# Patient Record
Sex: Female | Born: 1965
Health system: Southern US, Community
[De-identification: ages and names within clinical notes are randomized; demographics above are authoritative.]

## PROBLEM LIST (undated history)

## (undated) DIAGNOSIS — J45909 Unspecified asthma, uncomplicated: Secondary | ICD-10-CM

## (undated) DIAGNOSIS — Z803 Family history of malignant neoplasm of breast: Secondary | ICD-10-CM

## (undated) DIAGNOSIS — T7840XA Allergy, unspecified, initial encounter: Secondary | ICD-10-CM

## (undated) DIAGNOSIS — N63 Unspecified lump in unspecified breast: Secondary | ICD-10-CM

## (undated) DIAGNOSIS — N189 Chronic kidney disease, unspecified: Secondary | ICD-10-CM

## (undated) DIAGNOSIS — I10 Essential (primary) hypertension: Secondary | ICD-10-CM

## (undated) HISTORY — DX: Allergy, unspecified, initial encounter: T78.40XA

## (undated) HISTORY — PX: DILATION AND CURETTAGE OF UTERUS: SHX78

## (undated) HISTORY — PX: HAND SURGERY: SHX662

## (undated) HISTORY — DX: Unspecified asthma, uncomplicated: J45.909

## (undated) HISTORY — DX: Family history of malignant neoplasm of breast: Z80.3

## (undated) HISTORY — DX: Essential (primary) hypertension: I10

## (undated) HISTORY — DX: Unspecified lump in unspecified breast: N63.0

## (undated) HISTORY — DX: Chronic kidney disease, unspecified: N18.9

---

## 2005-02-22 ENCOUNTER — Other Ambulatory Visit: Admission: RE | Admit: 2005-02-22 | Discharge: 2005-02-22 | Payer: Self-pay | Admitting: Obstetrics and Gynecology

## 2006-04-30 ENCOUNTER — Encounter: Admission: RE | Admit: 2006-04-30 | Discharge: 2006-04-30 | Payer: Self-pay | Admitting: Obstetrics and Gynecology

## 2007-08-12 ENCOUNTER — Inpatient Hospital Stay (HOSPITAL_COMMUNITY): Admission: AD | Admit: 2007-08-12 | Discharge: 2007-08-14 | Payer: Self-pay | Admitting: Obstetrics and Gynecology

## 2010-12-11 ENCOUNTER — Encounter: Payer: Self-pay | Admitting: Obstetrics and Gynecology

## 2011-08-31 LAB — RAPID HIV SCREEN (WH-MAU): Rapid HIV Screen: NONREACTIVE

## 2011-08-31 LAB — CBC
Hemoglobin: 13.7
MCHC: 34.4
MCHC: 34.6
MCV: 91.2
RBC: 4.33
WBC: 12 — ABNORMAL HIGH

## 2012-03-22 ENCOUNTER — Other Ambulatory Visit: Payer: Self-pay | Admitting: Obstetrics and Gynecology

## 2012-03-22 DIAGNOSIS — N6320 Unspecified lump in the left breast, unspecified quadrant: Secondary | ICD-10-CM

## 2012-03-26 ENCOUNTER — Ambulatory Visit
Admission: RE | Admit: 2012-03-26 | Discharge: 2012-03-26 | Disposition: A | Discharge status: Home / Self Care | Payer: BC Managed Care – PPO | Source: Ambulatory Visit | Attending: Obstetrics and Gynecology | Admitting: Obstetrics and Gynecology

## 2012-03-26 ENCOUNTER — Other Ambulatory Visit: Payer: Self-pay | Admitting: Obstetrics and Gynecology

## 2012-03-26 DIAGNOSIS — N6320 Unspecified lump in the left breast, unspecified quadrant: Secondary | ICD-10-CM

## 2012-04-01 ENCOUNTER — Ambulatory Visit
Admission: RE | Admit: 2012-04-01 | Discharge: 2012-04-01 | Disposition: A | Discharge status: Home / Self Care | Payer: BC Managed Care – PPO | Source: Ambulatory Visit | Attending: Obstetrics and Gynecology | Admitting: Obstetrics and Gynecology

## 2012-04-01 DIAGNOSIS — N6320 Unspecified lump in the left breast, unspecified quadrant: Secondary | ICD-10-CM

## 2012-04-01 HISTORY — PX: BREAST BIOPSY: SHX20

## 2012-06-06 ENCOUNTER — Other Ambulatory Visit: Payer: Self-pay | Admitting: Obstetrics and Gynecology

## 2012-06-06 DIAGNOSIS — N63 Unspecified lump in unspecified breast: Secondary | ICD-10-CM

## 2012-06-12 ENCOUNTER — Ambulatory Visit
Admission: RE | Admit: 2012-06-12 | Discharge: 2012-06-12 | Disposition: A | Discharge status: Home / Self Care | Payer: BC Managed Care – PPO | Source: Ambulatory Visit | Attending: Obstetrics and Gynecology | Admitting: Obstetrics and Gynecology

## 2012-06-12 DIAGNOSIS — N63 Unspecified lump in unspecified breast: Secondary | ICD-10-CM

## 2012-07-05 ENCOUNTER — Other Ambulatory Visit: Payer: Self-pay | Admitting: Obstetrics and Gynecology

## 2012-07-05 DIAGNOSIS — N6320 Unspecified lump in the left breast, unspecified quadrant: Secondary | ICD-10-CM

## 2012-07-12 ENCOUNTER — Ambulatory Visit
Admission: RE | Admit: 2012-07-12 | Discharge: 2012-07-12 | Disposition: A | Discharge status: Home / Self Care | Payer: BC Managed Care – PPO | Source: Ambulatory Visit | Attending: Obstetrics and Gynecology | Admitting: Obstetrics and Gynecology

## 2012-07-12 DIAGNOSIS — N6320 Unspecified lump in the left breast, unspecified quadrant: Secondary | ICD-10-CM

## 2012-07-12 MED ORDER — GADOBENATE DIMEGLUMINE 529 MG/ML IV SOLN
12.0000 mL | Freq: Once | INTRAVENOUS | Status: AC | PRN
  Administered 2012-07-12: 12 mL via INTRAVENOUS

## 2012-07-15 ENCOUNTER — Encounter (INDEPENDENT_AMBULATORY_CARE_PROVIDER_SITE_OTHER): Payer: Self-pay | Admitting: General Surgery

## 2012-07-16 ENCOUNTER — Ambulatory Visit (INDEPENDENT_AMBULATORY_CARE_PROVIDER_SITE_OTHER): Payer: BC Managed Care – PPO | Admitting: General Surgery

## 2012-07-25 ENCOUNTER — Encounter (INDEPENDENT_AMBULATORY_CARE_PROVIDER_SITE_OTHER): Payer: Self-pay | Admitting: General Surgery

## 2012-07-25 ENCOUNTER — Ambulatory Visit (INDEPENDENT_AMBULATORY_CARE_PROVIDER_SITE_OTHER): Payer: BC Managed Care – PPO | Admitting: General Surgery

## 2012-07-25 VITALS — BP 112/70 | HR 64 | Temp 98.4°F | Resp 12 | Ht 64.0 in | Wt 141.0 lb

## 2012-07-25 DIAGNOSIS — N63 Unspecified lump in unspecified breast: Secondary | ICD-10-CM | POA: Insufficient documentation

## 2012-07-25 NOTE — Progress Notes (Signed)
Patient ID: Crystal Maynard, female   DOB: 09/11/1966, 46 y.o.   MRN: [id number]  Chief Complaint  Patient presents with  . Breast Problem    new pt- eval lt breast mass    HPI Crystal Maynard is a 46 y.o. female there is a small lesion approximately o'clock at her annual GYN appointment. Patient underwent mammography and ultrasound biopsy which revealed benign breast parenchyma with fibrosis and acellular debris. Subsequently this patient noticed an enlargement of the biopsy site, itself subsequently underwent MRI which revealed features compatible pernicious cyst and multiple additional small cysts in both breasts with no evidence of malignancy. Patient is awake the family history of breast cancer in her mother and aunts, and his concern for possible use in the future, like to have the mass removed. Patient states she noticed no axillary lymphadenopathy at any time prior to this. HPI  Past Medical History  Diagnosis Date  . Asthma   . Breast mass     left breast    Past Surgical History  Procedure Date  . Hand surgery     3 surgeries on left hand    Family History  Problem Relation Age of Onset  . Cancer Mother     breast  . Cancer Maternal Aunt     breast  . Cancer Maternal Uncle     stomach    Social History History  Substance Use Topics  . Smoking status: Never Smoker   . Smokeless tobacco: Not on file  . Alcohol Use: No    No Known Allergies  Current Outpatient Prescriptions  Medication Sig Dispense Refill  . albuterol (PROVENTIL) (2.5 MG/3ML) 0.083% nebulizer solution Take 2.5 mg by nebulization every 6 (six) hours as needed.      . mometasone (ASMANEX) 220 MCG/INH inhaler Inhale 2 puffs into the lungs daily.        Review of Systems Review of Systems  Constitutional: Negative.   HENT: Negative.   Eyes: Negative.   Respiratory: Negative.   Cardiovascular: Negative.   Gastrointestinal: Negative.   Musculoskeletal: Negative.   Neurological: Negative.     Hematological: Negative.     Blood pressure 112/70, pulse 64, temperature 98.4 F (36.9 C), temperature source Temporal, resp. rate 12, height 5' 4" (1.626 m), weight 141 lb (63.957 kg).  Physical Exam Physical Exam  Constitutional: She is oriented to person, place, and time. She appears well-developed and well-nourished.  HENT:  Head: Normocephalic and atraumatic.  Eyes: Conjunctivae and EOM are normal. Pupils are equal, round, and reactive to light.  Neck: Normal range of motion. Neck supple.  Cardiovascular: Normal rate, regular rhythm and normal heart sounds.   Pulmonary/Chest: Effort normal and breath sounds normal. Left breast exhibits mass (1 o'clock position).    Abdominal: Soft. Bowel sounds are normal.  Musculoskeletal: Normal range of motion.  Neurological: She is alert and oriented to person, place, and time.    Data Reviewed Patient's mammography, MRI, and ultrasound pathology report reviewed as above.  Assessment    46-year-old female with likely benign breast cyst in her left breast at 1:00 position.    Plan    1. Due the patient's family history and concern for possible future malignancy we will schedule patient for removal of her breast mass.  2.All risks and benefits were discussed with the patient, to generally include infection, bleeding, damage to surrounding structures, and recurrence. Alternatives were offered and described.  All questions were answered and the patient voiced understanding of   the procedure and wishes to proceed at this point.        Crystal Delio Jr., Crystal Maynard 07/25/2012, 10:05 AM    

## 2012-07-30 ENCOUNTER — Encounter (HOSPITAL_COMMUNITY): Payer: Self-pay | Admitting: Respiratory Therapy

## 2012-08-01 ENCOUNTER — Encounter (HOSPITAL_COMMUNITY): Payer: Self-pay

## 2012-08-01 ENCOUNTER — Encounter (HOSPITAL_COMMUNITY)
Admission: RE | Admit: 2012-08-01 | Discharge: 2012-08-01 | Disposition: A | Discharge status: Home / Self Care | Payer: BC Managed Care – PPO | Source: Ambulatory Visit | Attending: General Surgery | Admitting: General Surgery

## 2012-08-01 LAB — SURGICAL PCR SCREEN
MRSA, PCR: NEGATIVE
Staphylococcus aureus: NEGATIVE

## 2012-08-01 LAB — CBC
HCT: 38.8 % (ref 36.0–46.0)
Hemoglobin: 13.1 g/dL (ref 12.0–15.0)
MCHC: 33.8 g/dL (ref 30.0–36.0)
MCV: 90.2 fL (ref 78.0–100.0)
RDW: 12.5 % (ref 11.5–15.5)

## 2012-08-01 NOTE — Pre-Procedure Instructions (Signed)
20 Crystal Maynard  08/01/2012   Your procedure is scheduled on:  08-09-2012  Report to [facility] at 5:30  AM.  Call this number if you have problems the morning of surgery: [phone]   Remember:   Do not eat food or drink:After Midnight    Take these medicines the morning of surgery with A SIP OF WATER: inhalers as needed and instructed by MD   Do not wear jewelry, make-up or nail polish.  Do not wear lotions, powders, or perfumes.  Do not shave 48 hours prior to surgery. Contacts, dentures or bridgework may not be worn into surgery.  Leave suitcase in the car. After surgery it may be brought to your room.    Patients discharged the day of surgery will not be allowed to drive home.    Name and phone number of your driver: ________________________  Special Instructions: CHG Shower Use Special Wash: 1/2 bottle night before surgery and 1/2 bottle morning of surgery.   Please read over the following fact sheets that you were given: Pain Booklet, Coughing and Deep Breathing, MRSA Information and Surgical Site Infection Prevention

## 2012-08-08 MED ORDER — CEFAZOLIN SODIUM-DEXTROSE 2-3 GM-% IV SOLR
2.0000 g | INTRAVENOUS | Status: AC
  Administered 2012-08-09: 2 g via INTRAVENOUS
  Filled 2012-08-08: qty 50

## 2012-08-09 ENCOUNTER — Ambulatory Visit (HOSPITAL_COMMUNITY)
Admission: RE | Admit: 2012-08-09 | Discharge: 2012-08-09 | Disposition: A | Discharge status: Home / Self Care | Payer: BC Managed Care – PPO | Source: Ambulatory Visit | Attending: General Surgery | Admitting: General Surgery

## 2012-08-09 ENCOUNTER — Encounter (HOSPITAL_COMMUNITY)
Admission: RE | Disposition: A | Discharge status: Home / Self Care | Payer: Self-pay | Source: Ambulatory Visit | Attending: General Surgery

## 2012-08-09 ENCOUNTER — Encounter (HOSPITAL_COMMUNITY): Payer: Self-pay | Admitting: Anesthesiology

## 2012-08-09 ENCOUNTER — Ambulatory Visit (HOSPITAL_COMMUNITY): Payer: BC Managed Care – PPO | Admitting: Anesthesiology

## 2012-08-09 ENCOUNTER — Encounter (HOSPITAL_COMMUNITY): Payer: Self-pay | Admitting: Surgery

## 2012-08-09 DIAGNOSIS — N6019 Diffuse cystic mastopathy of unspecified breast: Secondary | ICD-10-CM | POA: Insufficient documentation

## 2012-08-09 DIAGNOSIS — Z01812 Encounter for preprocedural laboratory examination: Secondary | ICD-10-CM | POA: Insufficient documentation

## 2012-08-09 DIAGNOSIS — Z803 Family history of malignant neoplasm of breast: Secondary | ICD-10-CM | POA: Insufficient documentation

## 2012-08-09 HISTORY — PX: BREAST LUMPECTOMY: SHX2

## 2012-08-09 SURGERY — BREAST LUMPECTOMY
Anesthesia: General | Site: Breast | Laterality: Left | Wound class: Clean

## 2012-08-09 MED ORDER — FENTANYL CITRATE 0.05 MG/ML IJ SOLN
INTRAMUSCULAR | Status: DC | PRN
  Administered 2012-08-09 (×2): 100 ug via INTRAVENOUS

## 2012-08-09 MED ORDER — LACTATED RINGERS IV SOLN
INTRAVENOUS | Status: DC | PRN
  Administered 2012-08-09: 07:00:00 via INTRAVENOUS

## 2012-08-09 MED ORDER — HYDROMORPHONE HCL PF 1 MG/ML IJ SOLN
INTRAMUSCULAR | Status: AC
  Filled 2012-08-09: qty 1

## 2012-08-09 MED ORDER — DEXAMETHASONE SODIUM PHOSPHATE 4 MG/ML IJ SOLN
INTRAMUSCULAR | Status: DC | PRN
  Administered 2012-08-09: 4 mg via INTRAVENOUS

## 2012-08-09 MED ORDER — LIDOCAINE HCL (CARDIAC) 20 MG/ML IV SOLN
INTRAVENOUS | Status: DC | PRN
  Administered 2012-08-09: 50 mg via INTRAVENOUS

## 2012-08-09 MED ORDER — OXYCODONE-ACETAMINOPHEN 5-325 MG PO TABS: 1.0000 | ORAL_TABLET | ORAL | Status: DC | PRN

## 2012-08-09 MED ORDER — BUPIVACAINE-EPINEPHRINE PF 0.25-1:200000 % IJ SOLN
INTRAMUSCULAR | Status: AC
  Filled 2012-08-09: qty 30

## 2012-08-09 MED ORDER — ONDANSETRON HCL 4 MG/2ML IJ SOLN: 4.0000 mg | Freq: Once | INTRAMUSCULAR | Status: DC | PRN

## 2012-08-09 MED ORDER — DEXTROSE 5 % IV SOLN
INTRAVENOUS | Status: DC | PRN
  Administered 2012-08-09: 07:00:00 via INTRAVENOUS

## 2012-08-09 MED ORDER — HYDROMORPHONE HCL PF 1 MG/ML IJ SOLN
0.2500 mg | INTRAMUSCULAR | Status: DC | PRN
  Administered 2012-08-09: 0.5 mg via INTRAVENOUS

## 2012-08-09 MED ORDER — PROPOFOL 10 MG/ML IV BOLUS
INTRAVENOUS | Status: DC | PRN
  Administered 2012-08-09: 200 mg via INTRAVENOUS

## 2012-08-09 MED ORDER — BUPIVACAINE HCL (PF) 0.25 % IJ SOLN
INTRAMUSCULAR | Status: AC
  Filled 2012-08-09: qty 30

## 2012-08-09 MED ORDER — ONDANSETRON HCL 4 MG/2ML IJ SOLN
INTRAMUSCULAR | Status: DC | PRN
  Administered 2012-08-09: 4 mg via INTRAVENOUS

## 2012-08-09 MED ORDER — BUPIVACAINE HCL (PF) 0.25 % IJ SOLN
INTRAMUSCULAR | Status: DC | PRN
  Administered 2012-08-09: 2 mL

## 2012-08-09 SURGICAL SUPPLY — 47 items
ADH SKN CLS APL DERMABOND .7 (GAUZE/BANDAGES/DRESSINGS) ×1
BINDER BREAST LRG (GAUZE/BANDAGES/DRESSINGS) IMPLANT
BINDER BREAST XLRG (GAUZE/BANDAGES/DRESSINGS) IMPLANT
BLADE SURG 15 STRL LF DISP TIS (BLADE) ×1 IMPLANT
BLADE SURG 15 STRL SS (BLADE) ×2
CANISTER SUCTION 2500CC (MISCELLANEOUS) IMPLANT
CHLORAPREP W/TINT 26ML (MISCELLANEOUS) ×2 IMPLANT
CLOTH BEACON ORANGE TIMEOUT ST (SAFETY) ×2 IMPLANT
CONT SPEC 4OZ CLIKSEAL STRL BL (MISCELLANEOUS) ×1 IMPLANT
COVER SURGICAL LIGHT HANDLE (MISCELLANEOUS) ×2 IMPLANT
DECANTER SPIKE VIAL GLASS SM (MISCELLANEOUS) ×1 IMPLANT
DERMABOND ADVANCED (GAUZE/BANDAGES/DRESSINGS) ×1
DERMABOND ADVANCED .7 DNX12 (GAUZE/BANDAGES/DRESSINGS) ×1 IMPLANT
DEVICE DUBIN SPECIMEN MAMMOGRA (MISCELLANEOUS) IMPLANT
DRAPE PED LAPAROTOMY (DRAPES) ×2 IMPLANT
ELECT CAUTERY BLADE 6.4 (BLADE) ×2 IMPLANT
ELECT REM PT RETURN 9FT ADLT (ELECTROSURGICAL) ×2
ELECTRODE REM PT RTRN 9FT ADLT (ELECTROSURGICAL) ×1 IMPLANT
GAUZE SPONGE 4X4 16PLY XRAY LF (GAUZE/BANDAGES/DRESSINGS) ×1 IMPLANT
GLOVE BIO SURGEON STRL SZ 6 (GLOVE) ×1 IMPLANT
GLOVE BIO SURGEON STRL SZ7 (GLOVE) ×2 IMPLANT
GLOVE BIOGEL PI IND STRL 6.5 (GLOVE) IMPLANT
GLOVE BIOGEL PI IND STRL 7.5 (GLOVE) ×1 IMPLANT
GLOVE BIOGEL PI INDICATOR 6.5 (GLOVE) ×1
GLOVE BIOGEL PI INDICATOR 7.5 (GLOVE) ×1
GLOVE SURG SS PI 7.0 STRL IVOR (GLOVE) ×1 IMPLANT
GOWN PREVENTION PLUS XXLARGE (GOWN DISPOSABLE) ×1 IMPLANT
GOWN STRL NON-REIN LRG LVL3 (GOWN DISPOSABLE) ×4 IMPLANT
KIT BASIN OR (CUSTOM PROCEDURE TRAY) ×2 IMPLANT
KIT MARKER MARGIN INK (KITS) IMPLANT
KIT ROOM TURNOVER OR (KITS) ×2 IMPLANT
NDL HYPO 25GX1X1/2 BEV (NEEDLE) ×1 IMPLANT
NEEDLE HYPO 25GX1X1/2 BEV (NEEDLE) ×2 IMPLANT
NS IRRIG 1000ML POUR BTL (IV SOLUTION) ×2 IMPLANT
PACK SURGICAL SETUP 50X90 (CUSTOM PROCEDURE TRAY) ×2 IMPLANT
PAD ARMBOARD 7.5X6 YLW CONV (MISCELLANEOUS) ×2 IMPLANT
PENCIL BUTTON HOLSTER BLD 10FT (ELECTRODE) ×2 IMPLANT
SPONGE LAP 4X18 X RAY DECT (DISPOSABLE) ×1 IMPLANT
SUT MNCRL AB 4-0 PS2 18 (SUTURE) ×2 IMPLANT
SUT VIC AB 3-0 SH 27 (SUTURE) ×4
SUT VIC AB 3-0 SH 27X BRD (SUTURE) ×1 IMPLANT
SYR BULB 3OZ (MISCELLANEOUS) ×2 IMPLANT
SYR CONTROL 10ML LL (SYRINGE) ×2 IMPLANT
TOWEL OR 17X24 6PK STRL BLUE (TOWEL DISPOSABLE) ×2 IMPLANT
TOWEL OR 17X26 10 PK STRL BLUE (TOWEL DISPOSABLE) ×2 IMPLANT
TUBE CONNECTING 12X1/4 (SUCTIONS) IMPLANT
YANKAUER SUCT BULB TIP NO VENT (SUCTIONS) IMPLANT

## 2012-08-09 NOTE — Transfer of Care (Signed)
Immediate Anesthesia Transfer of Care Note  Patient: Crystal Maynard  Procedure(s) Performed: Procedure(s) (LRB) with comments: LUMPECTOMY (Left)  Patient Location: PACU  Anesthesia Type: General  Level of Consciousness: sedated, patient cooperative and responds to stimulation  Airway & Oxygen Therapy: Patient Spontanous Breathing and Patient connected to nasal cannula oxygen  Post-op Assessment: Report given to PACU RN, Post -op Vital signs reviewed and stable, Patient moving all extremities and Patient moving all extremities X 4  Post vital signs: Reviewed and stable  Complications: No apparent anesthesia complications

## 2012-08-09 NOTE — H&P (View-Only) (Signed)
Patient ID: Crystal Maynard, female   DOB: 05/30/1966, 46 y.o.   MRN: [id number]  Chief Complaint  Patient presents with  . Breast Problem    new pt- eval lt breast mass    HPI Crystal Maynard is a 46 y.o. female there is a small lesion approximately o'clock at her annual GYN appointment. Patient underwent mammography and ultrasound biopsy which revealed benign breast parenchyma with fibrosis and acellular debris. Subsequently this patient noticed an enlargement of the biopsy site, itself subsequently underwent MRI which revealed features compatible pernicious cyst and multiple additional small cysts in both breasts with no evidence of malignancy. Patient is awake the family history of breast cancer in her mother and aunts, and his concern for possible use in the future, like to have the mass removed. Patient states she noticed no axillary lymphadenopathy at any time prior to this. HPI  Past Medical History  Diagnosis Date  . Asthma   . Breast mass     left breast    Past Surgical History  Procedure Date  . Hand surgery     3 surgeries on left hand    Family History  Problem Relation Age of Onset  . Cancer Mother     breast  . Cancer Maternal Aunt     breast  . Cancer Maternal Uncle     stomach    Social History History  Substance Use Topics  . Smoking status: Never Smoker   . Smokeless tobacco: Not on file  . Alcohol Use: No    No Known Allergies  Current Outpatient Prescriptions  Medication Sig Dispense Refill  . albuterol (PROVENTIL) (2.5 MG/3ML) 0.083% nebulizer solution Take 2.5 mg by nebulization every 6 (six) hours as needed.      . mometasone (ASMANEX) 220 MCG/INH inhaler Inhale 2 puffs into the lungs daily.        Review of Systems Review of Systems  Constitutional: Negative.   HENT: Negative.   Eyes: Negative.   Respiratory: Negative.   Cardiovascular: Negative.   Gastrointestinal: Negative.   Musculoskeletal: Negative.   Neurological: Negative.     Hematological: Negative.     Blood pressure 112/70, pulse 64, temperature 98.4 F (36.9 C), temperature source Temporal, resp. rate 12, height 5\' 4"  (1.626 m), weight 141 lb (63.957 kg).  Physical Exam Physical Exam  Constitutional: She is oriented to person, place, and time. She appears well-developed and well-nourished.  HENT:  Head: Normocephalic and atraumatic.  Eyes: Conjunctivae and EOM are normal. Pupils are equal, round, and reactive to light.  Neck: Normal range of motion. Neck supple.  Cardiovascular: Normal rate, regular rhythm and normal heart sounds.   Pulmonary/Chest: Effort normal and breath sounds normal. Left breast exhibits mass (1 o'clock position).    Abdominal: Soft. Bowel sounds are normal.  Musculoskeletal: Normal range of motion.  Neurological: She is alert and oriented to person, place, and time.    Data Reviewed Patient's mammography, MRI, and ultrasound pathology report reviewed as above.  Assessment    46 year old female with likely benign breast cyst in her left breast at 1:00 position.    Plan    1. Due the patient's family history and concern for possible future malignancy we will schedule patient for removal of her breast mass.  2.All risks and benefits were discussed with the patient, to generally include infection, bleeding, damage to surrounding structures, and recurrence. Alternatives were offered and described.  All questions were answered and the patient voiced understanding of  the procedure and wishes to proceed at this point.        Marigene Ehlers., Rykin Route 07/25/2012, 10:05 AM

## 2012-08-09 NOTE — Addendum Note (Signed)
Addendum  created 08/09/12 1046 by Elisabeth Most, CRNA   Modules edited:Anesthesia LDA

## 2012-08-09 NOTE — Addendum Note (Signed)
Addendum  created 08/09/12 1046 by Chrishawn Boley A Savannah Morford, CRNA   Modules edited:Anesthesia LDA    

## 2012-08-09 NOTE — Op Note (Signed)
Pre Operative Diagnosis:  Left breast mass  Post Operative Diagnosis: same  Procedure: L breast mass removal  Surgeon: Dr. Axel Filler  Assistant: Dr. Donell Beers  Anesthesia: Gen. Endotracheal anesthesia  EBL: 5 cc  Complications: none  Counts: reported as correct x 2  Findings:  The patient is small approximately half centimeter size Mass which communicated with the duct which was excised in its entirety  Indications for procedure:  The patient is a 46 year old female who is seen in clinic secondary to a left breast mass which had been previously biopsied and seen to have benign fibrotic tissue.  The patient has a sure history family history of breast cancer and patient wanted this removed secondary to ongoing self breast exams. Thus the patient was taken back to the operating room for removal of elective mass of the left breast.   Details of the procedure: After the patient was consented patient was taken back to the operating room patient was placed in supine position bilateral SCDs in place. After the patient was prepped and draping in the usual sterile fashion a timeout was called or facts were verified.  GU 1 cm curvilinear incision was made in the 1:00 position of the breast just superior to the mass. Energy to maintain hemostasis and dissection was carried out and the mass. The mass was then grasped with an Allis clamp and cutters used to excise the mass in its entirety. Was done the area was irrigated out with sterile saline and hemostasis achieved with a Bovie cautery. The interval area was reapproximated using 3-0 Vicryl. The skin is reapproximated using a 4-0 Monocryl subcuticular fashion.  The skin was dressed with Dermabond.  The patient was awakened from general anesthesia and was taken recovery in stable condition.

## 2012-08-09 NOTE — Anesthesia Postprocedure Evaluation (Signed)
  Anesthesia Post-op Note  Patient: Crystal Maynard  Procedure(s) Performed: Procedure(s) (LRB) with comments: LUMPECTOMY (Left)  Patient Location: PACU  Anesthesia Type: General  Level of Consciousness: awake, alert , oriented, sedated and patient cooperative  Airway and Oxygen Therapy: Patient Spontanous Breathing  Post-op Pain: mild  Post-op Assessment: Post-op Vital signs reviewed, Patient's Cardiovascular Status Stable, Respiratory Function Stable, Patent Airway, No signs of Nausea or vomiting and Pain level controlled  Post-op Vital Signs: stable  Complications: No apparent anesthesia complications

## 2012-08-09 NOTE — Interval H&P Note (Signed)
History and Physical Interval Note:  08/09/2012 7:10 AM  Crystal Maynard  has presented today for surgery, with the diagnosis of Left breast mass  The various methods of treatment have been discussed with the patient and family. After consideration of risks, benefits and other options for treatment, the patient has consented to  Procedure(s) (LRB) with comments: LUMPECTOMY (Left) - Left breast lumpectomy as a surgical intervention .  The patient's history has been reviewed, patient examined, no change in status, stable for surgery.  I have reviewed the patient's chart and labs.  Questions were answered to the patient's satisfaction.     Marigene Ehlers., Jed Limerick

## 2012-08-09 NOTE — Preoperative (Signed)
Beta Blockers   Reason not to administer Beta Blockers:Not Applicable 

## 2012-08-09 NOTE — Anesthesia Procedure Notes (Signed)
Procedure Name: LMA Insertion Date/Time: 08/09/2012 7:42 AM Performed by: Wray Kearns A Pre-anesthesia Checklist: Patient identified, Timeout performed, Emergency Drugs available, Suction available and Patient being monitored Patient Re-evaluated:Patient Re-evaluated prior to inductionOxygen Delivery Method: Circle system utilized Preoxygenation: Pre-oxygenation with 100% oxygen Intubation Type: IV induction Ventilation: Mask ventilation without difficulty LMA: LMA inserted LMA Size: 4.0 Tube type: Oral Number of attempts: 1 Placement Confirmation: positive ETCO2 and breath sounds checked- equal and bilateral Tube secured with: Tape Dental Injury: Teeth and Oropharynx as per pre-operative assessment

## 2012-08-09 NOTE — Anesthesia Preprocedure Evaluation (Signed)
Anesthesia Evaluation  Patient identified by MRN, date of birth, ID band Patient awake    Airway Mallampati: I TM Distance: >3 FB Neck ROM: full    Dental   Pulmonary asthma ,          Cardiovascular Rhythm:regular Rate:Normal     Neuro/Psych    GI/Hepatic   Endo/Other    Renal/GU      Musculoskeletal   Abdominal   Peds  Hematology   Anesthesia Other Findings   Reproductive/Obstetrics                           Anesthesia Physical Anesthesia Plan  ASA: II  Anesthesia Plan: General   Post-op Pain Management:    Induction: Intravenous  Airway Management Planned: LMA and Oral ETT  Additional Equipment:   Intra-op Plan:   Post-operative Plan: Extubation in OR  Informed Consent: I have reviewed the patients History and Physical, chart, labs and discussed the procedure including the risks, benefits and alternatives for the proposed anesthesia with the patient or authorized representative who has indicated his/her understanding and acceptance.     Plan Discussed with: CRNA, Anesthesiologist and Surgeon  Anesthesia Plan Comments:         Anesthesia Quick Evaluation

## 2012-08-13 ENCOUNTER — Encounter (HOSPITAL_COMMUNITY): Payer: Self-pay | Admitting: General Surgery

## 2012-08-26 ENCOUNTER — Encounter (INDEPENDENT_AMBULATORY_CARE_PROVIDER_SITE_OTHER): Payer: Self-pay | Admitting: General Surgery

## 2012-08-26 ENCOUNTER — Ambulatory Visit (INDEPENDENT_AMBULATORY_CARE_PROVIDER_SITE_OTHER): Payer: BC Managed Care – PPO | Admitting: General Surgery

## 2012-08-26 VITALS — BP 126/80 | HR 71 | Temp 98.8°F | Resp 16 | Ht 64.0 in | Wt 138.6 lb

## 2012-08-26 DIAGNOSIS — Z9889 Other specified postprocedural states: Secondary | ICD-10-CM

## 2012-08-26 NOTE — Progress Notes (Signed)
Subjective:     Patient ID: Crystal Maynard, female   DOB: 31-Mar-1966, 46 y.o.   MRN: [id number]  HPI The patient is a 46 year old female here for followup after a left breast mass excision. Patient did well postoperatively had minimal pain and moves been healing well. The patient has no complaints at this time.  I discussed the pathology with the patient which resulted in fibrocystic disease and no malignancy.  Review of Systems  All other systems reviewed and are negative.       Objective:   Physical Exam On exam the wound is clean dry and intact no erythema or drainage and    Assessment:     The patient is a 46 year old female status post left breast excision of left breast mass. Was feeling great without any complications. Pathology was discussed with the patient the patient is doing well at this time.    Plan:     1. Patient in followup. This time patient is to continue with self breast exams as directed by her primary care physician  2. Patient follow up when necessary

## 2013-04-30 ENCOUNTER — Other Ambulatory Visit: Payer: Self-pay

## 2013-04-30 DIAGNOSIS — Z1231 Encounter for screening mammogram for malignant neoplasm of breast: Secondary | ICD-10-CM

## 2013-06-02 ENCOUNTER — Ambulatory Visit
Admission: RE | Admit: 2013-06-02 | Discharge: 2013-06-02 | Disposition: A | Discharge status: Home / Self Care | Payer: BC Managed Care – PPO | Source: Ambulatory Visit

## 2013-06-02 DIAGNOSIS — Z1231 Encounter for screening mammogram for malignant neoplasm of breast: Secondary | ICD-10-CM

## 2013-06-03 ENCOUNTER — Other Ambulatory Visit: Payer: Self-pay | Admitting: Obstetrics and Gynecology

## 2013-06-03 DIAGNOSIS — Z803 Family history of malignant neoplasm of breast: Secondary | ICD-10-CM

## 2013-06-13 ENCOUNTER — Ambulatory Visit
Admission: RE | Admit: 2013-06-13 | Discharge: 2013-06-13 | Disposition: A | Discharge status: Home / Self Care | Payer: BC Managed Care – PPO | Source: Ambulatory Visit | Attending: Obstetrics and Gynecology | Admitting: Obstetrics and Gynecology

## 2013-06-13 DIAGNOSIS — Z803 Family history of malignant neoplasm of breast: Secondary | ICD-10-CM

## 2013-06-13 MED ORDER — GADOBENATE DIMEGLUMINE 529 MG/ML IV SOLN
12.0000 mL | Freq: Once | INTRAVENOUS | Status: AC | PRN
  Administered 2013-06-13: 12 mL via INTRAVENOUS

## 2013-06-30 ENCOUNTER — Other Ambulatory Visit: Payer: Self-pay | Admitting: Obstetrics and Gynecology

## 2013-06-30 DIAGNOSIS — R928 Other abnormal and inconclusive findings on diagnostic imaging of breast: Secondary | ICD-10-CM

## 2013-07-07 ENCOUNTER — Ambulatory Visit
Admission: RE | Admit: 2013-07-07 | Discharge: 2013-07-07 | Disposition: A | Discharge status: Home / Self Care | Payer: BC Managed Care – PPO | Source: Ambulatory Visit | Attending: Obstetrics and Gynecology | Admitting: Obstetrics and Gynecology

## 2013-07-07 ENCOUNTER — Other Ambulatory Visit: Payer: Self-pay | Admitting: Obstetrics and Gynecology

## 2013-07-07 DIAGNOSIS — R928 Other abnormal and inconclusive findings on diagnostic imaging of breast: Secondary | ICD-10-CM

## 2013-07-07 HISTORY — PX: BREAST BIOPSY: SHX20

## 2014-01-07 ENCOUNTER — Other Ambulatory Visit: Payer: Self-pay | Admitting: Obstetrics and Gynecology

## 2014-01-07 ENCOUNTER — Ambulatory Visit
Admission: RE | Admit: 2014-01-07 | Discharge: 2014-01-07 | Disposition: A | Discharge status: Home / Self Care | Payer: BC Managed Care – PPO | Source: Ambulatory Visit | Attending: Obstetrics and Gynecology | Admitting: Obstetrics and Gynecology

## 2014-01-07 DIAGNOSIS — N63 Unspecified lump in unspecified breast: Secondary | ICD-10-CM

## 2014-01-16 ENCOUNTER — Other Ambulatory Visit: Payer: Self-pay | Admitting: Obstetrics and Gynecology

## 2014-01-16 DIAGNOSIS — Z803 Family history of malignant neoplasm of breast: Secondary | ICD-10-CM

## 2014-01-23 ENCOUNTER — Ambulatory Visit
Admission: RE | Admit: 2014-01-23 | Discharge: 2014-01-23 | Disposition: A | Discharge status: Home / Self Care | Payer: BC Managed Care – PPO | Source: Ambulatory Visit | Attending: Obstetrics and Gynecology | Admitting: Obstetrics and Gynecology

## 2014-01-23 DIAGNOSIS — Z803 Family history of malignant neoplasm of breast: Secondary | ICD-10-CM

## 2014-01-23 MED ORDER — GADOBENATE DIMEGLUMINE 529 MG/ML IV SOLN
12.0000 mL | Freq: Once | INTRAVENOUS | Status: AC | PRN
  Administered 2014-01-23: 12 mL via INTRAVENOUS

## 2014-02-03 ENCOUNTER — Telehealth: Payer: Self-pay | Admitting: Genetic Counselor

## 2014-02-03 NOTE — Telephone Encounter (Signed)
Offered to move appointment time up to tomorrow, 3/18, but she was unable to do this.

## 2014-02-17 ENCOUNTER — Telehealth: Payer: Self-pay | Admitting: *Deleted

## 2014-02-17 NOTE — Telephone Encounter (Signed)
Called pt to inform her that Santiago Glad will no longer be here and I need to reschedule her.  Confirmed 02/20/14 genetic appt w/ pt.

## 2014-02-20 ENCOUNTER — Other Ambulatory Visit: Payer: BC Managed Care – PPO

## 2014-02-20 ENCOUNTER — Telehealth: Payer: Self-pay | Admitting: *Deleted

## 2014-02-20 NOTE — Telephone Encounter (Signed)
Called pt to inform her that we do not have a genetic counselor here today and we needed to reschedule.  Confirmed 03/05/14 genetic appt w/ pt.

## 2014-03-05 ENCOUNTER — Ambulatory Visit (HOSPITAL_BASED_OUTPATIENT_CLINIC_OR_DEPARTMENT_OTHER): Payer: BC Managed Care – PPO | Admitting: Genetic Counselor

## 2014-03-05 ENCOUNTER — Other Ambulatory Visit: Payer: BC Managed Care – PPO

## 2014-03-05 DIAGNOSIS — IMO0002 Reserved for concepts with insufficient information to code with codable children: Secondary | ICD-10-CM

## 2014-03-05 DIAGNOSIS — Z803 Family history of malignant neoplasm of breast: Secondary | ICD-10-CM | POA: Insufficient documentation

## 2014-03-05 NOTE — Progress Notes (Signed)
Patient Name: Crystal Maynard Patient Age: 48 y.o. Encounter Date: 03/05/2014  Referring Physician: Luz Lex, MD [address], [place], [place] [address]  Primary Care Provider: Luz Lex, MD   Ms. Crystal Maynard, a 48 y.o. female, is being seen at the [facility] due to a family history of breast cancer.  She presents to clinic today with her mother to discuss the possibility of a hereditary predisposition to cancer and discuss whether genetic testing is warranted.  HISTORY OF PRESENT ILLNESS: Crystal Maynard has no personal history of cancer and is in generally good health. She reports a history of two breast biopsies (left in 2013 and right in 2014) that were benign. She states that she has a small lump in her left axilla that has not been biopsied, but is being watched. She undergoes a yearly gynecologic exam, mammogram and breast MRI due to her family history. She has not yet needed a colonoscopy.  Past Medical History  Diagnosis Date  . Asthma   . Breast mass     left breast  . Family history of malignant neoplasm of breast     Past Surgical History  Procedure Laterality Date  . Hand surgery      3 surgeries on left hand  . Dilation and curettage of uterus    . Breast lumpectomy  08/09/2012    Procedure: LUMPECTOMY;  Surgeon: Ralene Ok, MD;  Location: [place];  Service: General;  Laterality: Left;    History   Social History  . Marital Status: Married    Spouse Name: N/A    Number of Children: N/A  . Years of Education: N/A   Social History Main Topics  . Smoking status: Never Smoker   . Smokeless tobacco: Not on file  . Alcohol Use: No  . Drug Use: No  . Sexual Activity: Not on file   Other Topics Concern  . Not on file   Social History Narrative  . No narrative on file     FAMILY HISTORY:   During the visit, a 4-generation pedigree was obtained. Significant diagnoses include the following:  Family History  Problem  Relation Age of Onset  . Breast cancer Mother     2 primaries at 67 and 24; Negative 25-gene panel ([employer])  . Breast cancer Maternal Aunt 69    now 49yo  . Pancreatic cancer Maternal Uncle 25    heavy smoker  . Cancer Cousin 50    "bone ca"; now 35yo; son of an unaffected maternal uncle  . Breast cancer Maternal Aunt 58    now 52 yo;BRCA1/BRCA2 negative    Additionally, Ms. Voytko has a brother as well as 2 sons and 2 daughters.  Ms. Speltz ancestry is [place] and [place]. There is no known Jewish ancestry and no consanguinity.  ASSESSMENT AND PLAN: Crystal Maynard is a 48 y.o. female with a family history of breast cancer in her mother (2 primaries) as well as two of three maternal aunts. This history is suggestive of a hereditary predisposition to cancer, but genetic testing in the family has not identified a causative mutation. Her mother had a 25 gene panel test that was negative and her aunt diagnosed at 35 reportedly had negative BRCA1/BRCA2 testing. We reviewed the characteristics, features and inheritance patterns of hereditary cancer syndromes and explained that there may be additional genes discovered in the future, for which testing would then be indicated.     There was no  genetic testing that was recommend for [name] at this time. She is aware she is at increased risk of cancer due to her family history. We recommended [name] continue to have a yearly mammogram, breast MRI, a yearly clinical breast exam, perform monthly breast self-exams and have a yearly gynecologic exam. Colon cancer screening is recommended to begin at age 41.     We encouraged [name] to remain in contact with Cancer Genetics annually so that we can update the family history and inform her of any changes in cancer genetics and testing that may be of benefit for this family. Ms.  Reifschneider questions were answered to her satisfaction today.   Thank you for the referral and allowing [place] to  share in the care of your patient.   The patient was seen for a total of 25 minutes, greater than 50% of which was spent face-to-face counseling. This patient was discussed with the referring provider who agrees with the above.

## 2014-03-12 ENCOUNTER — Other Ambulatory Visit: Payer: BC Managed Care – PPO

## 2014-03-12 ENCOUNTER — Encounter: Payer: BC Managed Care – PPO | Admitting: Genetic Counselor

## 2014-09-16 ENCOUNTER — Other Ambulatory Visit: Payer: Self-pay | Admitting: Obstetrics and Gynecology

## 2014-09-16 DIAGNOSIS — N63 Unspecified lump in unspecified breast: Secondary | ICD-10-CM

## 2014-09-16 DIAGNOSIS — N644 Mastodynia: Secondary | ICD-10-CM

## 2014-09-18 ENCOUNTER — Other Ambulatory Visit: Payer: Self-pay

## 2014-09-28 ENCOUNTER — Other Ambulatory Visit: Payer: BC Managed Care – PPO

## 2014-10-01 ENCOUNTER — Ambulatory Visit
Admission: RE | Admit: 2014-10-01 | Discharge: 2014-10-01 | Disposition: A | Discharge status: Home / Self Care | Payer: BC Managed Care – PPO | Source: Ambulatory Visit | Attending: Obstetrics and Gynecology | Admitting: Obstetrics and Gynecology

## 2014-10-01 DIAGNOSIS — N644 Mastodynia: Secondary | ICD-10-CM

## 2014-10-01 DIAGNOSIS — N63 Unspecified lump in unspecified breast: Secondary | ICD-10-CM

## 2014-12-03 ENCOUNTER — Other Ambulatory Visit: Payer: Self-pay

## 2014-12-03 DIAGNOSIS — Z1231 Encounter for screening mammogram for malignant neoplasm of breast: Secondary | ICD-10-CM

## 2015-01-08 ENCOUNTER — Ambulatory Visit
Admission: RE | Admit: 2015-01-08 | Discharge: 2015-01-08 | Disposition: A | Discharge status: Home / Self Care | Payer: BLUE CROSS/BLUE SHIELD | Source: Ambulatory Visit

## 2015-01-08 DIAGNOSIS — Z1231 Encounter for screening mammogram for malignant neoplasm of breast: Secondary | ICD-10-CM

## 2015-01-12 ENCOUNTER — Other Ambulatory Visit: Payer: Self-pay | Admitting: Obstetrics and Gynecology

## 2015-01-12 DIAGNOSIS — N63 Unspecified lump in unspecified breast: Secondary | ICD-10-CM

## 2015-01-22 ENCOUNTER — Ambulatory Visit
Admission: RE | Admit: 2015-01-22 | Discharge: 2015-01-22 | Disposition: A | Discharge status: Home / Self Care | Payer: BLUE CROSS/BLUE SHIELD | Source: Ambulatory Visit | Attending: Obstetrics and Gynecology | Admitting: Obstetrics and Gynecology

## 2015-01-22 ENCOUNTER — Other Ambulatory Visit: Payer: Self-pay | Admitting: Obstetrics and Gynecology

## 2015-01-22 DIAGNOSIS — N63 Unspecified lump in unspecified breast: Secondary | ICD-10-CM

## 2015-02-02 ENCOUNTER — Other Ambulatory Visit: Payer: Self-pay | Admitting: Obstetrics and Gynecology

## 2015-02-02 DIAGNOSIS — Z803 Family history of malignant neoplasm of breast: Secondary | ICD-10-CM

## 2015-02-04 ENCOUNTER — Other Ambulatory Visit: Payer: Self-pay | Admitting: Obstetrics and Gynecology

## 2015-02-08 LAB — CYTOLOGY - PAP

## 2015-02-11 ENCOUNTER — Ambulatory Visit
Admission: RE | Admit: 2015-02-11 | Discharge: 2015-02-11 | Disposition: A | Discharge status: Home / Self Care | Payer: BLUE CROSS/BLUE SHIELD | Source: Ambulatory Visit | Attending: Obstetrics and Gynecology | Admitting: Obstetrics and Gynecology

## 2015-02-11 DIAGNOSIS — Z803 Family history of malignant neoplasm of breast: Secondary | ICD-10-CM

## 2015-02-11 MED ORDER — GADOBENATE DIMEGLUMINE 529 MG/ML IV SOLN
12.0000 mL | Freq: Once | INTRAVENOUS | Status: AC | PRN
  Administered 2015-02-11: 12 mL via INTRAVENOUS

## 2015-02-15 ENCOUNTER — Other Ambulatory Visit: Payer: Self-pay | Admitting: Obstetrics and Gynecology

## 2015-02-15 DIAGNOSIS — R928 Other abnormal and inconclusive findings on diagnostic imaging of breast: Secondary | ICD-10-CM

## 2015-02-18 ENCOUNTER — Ambulatory Visit
Admission: RE | Admit: 2015-02-18 | Discharge: 2015-02-18 | Disposition: A | Discharge status: Home / Self Care | Payer: BLUE CROSS/BLUE SHIELD | Source: Ambulatory Visit | Attending: Obstetrics and Gynecology | Admitting: Obstetrics and Gynecology

## 2015-02-18 DIAGNOSIS — R928 Other abnormal and inconclusive findings on diagnostic imaging of breast: Secondary | ICD-10-CM

## 2015-02-18 HISTORY — PX: BREAST BIOPSY: SHX20

## 2015-08-26 ENCOUNTER — Encounter (HOSPITAL_COMMUNITY): Payer: Self-pay | Admitting: *Deleted

## 2015-08-26 ENCOUNTER — Inpatient Hospital Stay (HOSPITAL_COMMUNITY)
Admit: 2015-08-26 | Discharge: 2015-08-26 | Disposition: A | Discharge status: Home / Self Care | Payer: BLUE CROSS/BLUE SHIELD | Source: Ambulatory Visit | Attending: Obstetrics and Gynecology | Admitting: Obstetrics and Gynecology

## 2015-08-26 DIAGNOSIS — J45909 Unspecified asthma, uncomplicated: Secondary | ICD-10-CM | POA: Diagnosis not present

## 2015-08-26 DIAGNOSIS — N200 Calculus of kidney: Secondary | ICD-10-CM | POA: Diagnosis not present

## 2015-08-26 DIAGNOSIS — Z975 Presence of (intrauterine) contraceptive device: Secondary | ICD-10-CM | POA: Diagnosis not present

## 2015-08-26 DIAGNOSIS — R1031 Right lower quadrant pain: Secondary | ICD-10-CM | POA: Diagnosis not present

## 2015-08-26 DIAGNOSIS — M549 Dorsalgia, unspecified: Secondary | ICD-10-CM | POA: Diagnosis present

## 2015-08-26 DIAGNOSIS — R109 Unspecified abdominal pain: Secondary | ICD-10-CM | POA: Diagnosis present

## 2015-08-26 LAB — CBC
HCT: 42.2 % (ref 36.0–46.0)
HEMOGLOBIN: 14.2 g/dL (ref 12.0–15.0)
MCH: 30.1 pg (ref 26.0–34.0)
MCHC: 33.6 g/dL (ref 30.0–36.0)
MCV: 89.4 fL (ref 78.0–100.0)
Platelets: 210 10*3/uL (ref 150–400)
RBC: 4.72 MIL/uL (ref 3.87–5.11)
RDW: 12.5 % (ref 11.5–15.5)
WBC: 11.3 10*3/uL — AB (ref 4.0–10.5)

## 2015-08-26 LAB — URINALYSIS, ROUTINE W REFLEX MICROSCOPIC
Bilirubin Urine: NEGATIVE
Glucose, UA: NEGATIVE mg/dL
Ketones, ur: 15 mg/dL — AB
Leukocytes, UA: NEGATIVE
Nitrite: NEGATIVE
Protein, ur: NEGATIVE mg/dL
SPECIFIC GRAVITY, URINE: 1.02 (ref 1.005–1.030)
Urobilinogen, UA: 0.2 mg/dL (ref 0.0–1.0)
pH: 5.5 (ref 5.0–8.0)

## 2015-08-26 LAB — URINE MICROSCOPIC-ADD ON

## 2015-08-26 LAB — WET PREP, GENITAL
CLUE CELLS WET PREP: NONE SEEN
TRICH WET PREP: NONE SEEN
Yeast Wet Prep HPF POC: NONE SEEN

## 2015-08-26 LAB — POCT PREGNANCY, URINE: Preg Test, Ur: NEGATIVE

## 2015-08-26 NOTE — MAU Note (Signed)
Pt presents to MAU with complaints of pain in her  right lower back and abdomen. Pt states she does have an IUD and has some pain in her vaginal area. Denies any vaginal bleeding or discharge. Hx of a kidney stone 17 years ago

## 2015-08-26 NOTE — Discharge Instructions (Signed)
Kidney Stones °Kidney stones (urolithiasis) are deposits that form inside your kidneys. The intense pain is caused by the stone moving through the urinary tract. When the stone moves, the ureter goes into spasm around the stone. The stone is usually passed in the urine.  °CAUSES  °· A disorder that makes certain neck glands produce too much parathyroid hormone (primary hyperparathyroidism). °· A buildup of uric acid crystals, similar to gout in your joints. °· Narrowing (stricture) of the ureter. °· A kidney obstruction present at birth (congenital obstruction). °· Previous surgery on the kidney or ureters. °· Numerous kidney infections. °SYMPTOMS  °· Feeling sick to your stomach (nauseous). °· Throwing up (vomiting). °· Blood in the urine (hematuria). °· Pain that usually spreads (radiates) to the groin. °· Frequency or urgency of urination. °DIAGNOSIS  °· Taking a history and physical exam. °· Blood or urine tests. °· CT scan. °· Occasionally, an examination of the inside of the urinary bladder (cystoscopy) is performed. °TREATMENT  °· Observation. °· Increasing your fluid intake. °· Extracorporeal shock wave lithotripsy--This is a noninvasive procedure that uses shock waves to break up kidney stones. °· Surgery may be needed if you have severe pain or persistent obstruction. There are various surgical procedures. Most of the procedures are performed with the use of small instruments. Only small incisions are needed to accommodate these instruments, so recovery time is minimized. °The size, location, and chemical composition are all important variables that will determine the proper choice of action for you. Talk to your health care provider to better understand your situation so that you will minimize the risk of injury to yourself and your kidney.  °HOME CARE INSTRUCTIONS  °· Drink enough water and fluids to keep your urine clear or pale yellow. This will help you to pass the stone or stone fragments. °· Strain  all urine through the provided strainer. Keep all particulate matter and stones for your health care provider to see. The stone causing the pain may be as small as a grain of salt. It is very important to use the strainer each and every time you pass your urine. The collection of your stone will allow your health care provider to analyze it and verify that a stone has actually passed. The stone analysis will often identify what you can do to reduce the incidence of recurrences. °· Only take over-the-counter or prescription medicines for pain, discomfort, or fever as directed by your health care provider. °· Keep all follow-up visits as told by your health care provider. This is important. °· Get follow-up X-rays if required. The absence of pain does not always mean that the stone has passed. It may have only stopped moving. If the urine remains completely obstructed, it can cause loss of kidney function or even complete destruction of the kidney. It is your responsibility to make sure X-rays and follow-ups are completed. Ultrasounds of the kidney can show blockages and the status of the kidney. Ultrasounds are not associated with any radiation and can be performed easily in a matter of minutes. °· Make changes to your daily diet as told by your health care provider. You may be told to: °¨ Limit the amount of salt that you eat. °¨ Eat 5 or more servings of fruits and vegetables each day. °¨ Limit the amount of meat, poultry, fish, and eggs that you eat. °· Collect a 24-hour urine sample as told by your health care provider. You may need to collect another urine sample every 6-12   months. °SEEK MEDICAL CARE IF: °· You experience pain that is progressive and unresponsive to any pain medicine you have been prescribed. °SEEK IMMEDIATE MEDICAL CARE IF:  °· Pain cannot be controlled with the prescribed medicine. °· You have a fever or shaking chills. °· The severity or intensity of pain increases over 18 hours and is not  relieved by pain medicine. °· You develop a new onset of abdominal pain. °· You feel faint or pass out. °· You are unable to urinate. °  °This information is not intended to replace advice given to you by your health care provider. Make sure you discuss any questions you have with your health care provider. °  °Document Released: 11/06/2005 Document Revised: 07/28/2015 Document Reviewed: 04/09/2013 °Elsevier Interactive Patient Education ©2016 [employer] ° °

## 2015-08-26 NOTE — MAU Provider Note (Signed)
Chief Complaint: Abdominal Pain and Back Pain   None     SUBJECTIVE HPI: Crystal Maynard is a 49 y.o. [id number] who presents to maternity admissions reporting RLQ pain and right lower back pain with onset tonight, worsening before arrival in MAU and resolving while in MAU.  She has hx of kidney stone 17 years ago and her pain felt similar at that time.  She has Mirena IUD which was placed 3 years ago and has irregular light menses, last menstrual period 2-3 months ago.  She denies nausea or vomiting.   She denies vaginal bleeding, vaginal itching/burning, urinary symptoms, h/a, dizziness, or fever/chills.     Abdominal Pain This is a new problem. The current episode started today. The onset quality is sudden. The problem occurs constantly. The most recent episode lasted 3 hours. The problem has been rapidly improving. The pain is located in the RLQ. The pain is at a severity of 10/10. The pain is severe. The quality of the pain is sharp. The abdominal pain radiates to the RLQ and back. Pertinent negatives include no constipation, diarrhea, dysuria, fever, frequency, headaches, nausea or vomiting. The pain is aggravated by certain positions and movement. The pain is relieved by nothing.  Back Pain Associated symptoms include abdominal pain. Pertinent negatives include no chest pain, dysuria, fever, headaches, pelvic pain or weakness.    Past Medical History  Diagnosis Date  . Asthma   . Breast mass     left breast  . Family history of malignant neoplasm of breast    Past Surgical History  Procedure Laterality Date  . Hand surgery      3 surgeries on left hand  . Dilation and curettage of uterus    . Breast lumpectomy  08/09/2012    Procedure: LUMPECTOMY;  Surgeon: Ralene Ok, MD;  Location: [facility];  Service: General;  Laterality: Left;   Social History   Social History  . Marital Status: Married    Spouse Name: N/A  . Number of Children: N/A  . Years of Education: N/A    Occupational History  . Not on file.   Social History Main Topics  . Smoking status: Never Smoker   . Smokeless tobacco: Not on file  . Alcohol Use: No  . Drug Use: No  . Sexual Activity: Not on file   Other Topics Concern  . Not on file   Social History Narrative   No current facility-administered medications on file prior to encounter.   Current Outpatient Prescriptions on File Prior to Encounter  Medication Sig Dispense Refill  . albuterol (PROVENTIL HFA;VENTOLIN HFA) 108 (90 BASE) MCG/ACT inhaler Inhale 2 puffs into the lungs every 6 (six) hours as needed. For shortness of breath    . mometasone (ASMANEX) 220 MCG/INH inhaler Inhale 2 puffs into the lungs daily as needed.     Marland Kitchen oxyCODONE-acetaminophen (ROXICET) 5-325 MG per tablet Take 1 tablet by mouth every 4 (four) hours as needed for pain. 30 tablet 0   No Known Allergies  ROS:  Review of Systems  Constitutional: Negative for fever, chills and fatigue.  HENT: Negative for sinus pressure.   Eyes: Negative for photophobia.  Respiratory: Negative for shortness of breath.   Cardiovascular: Negative for chest pain.  Gastrointestinal: Positive for abdominal pain. Negative for nausea, vomiting, diarrhea and constipation.  Genitourinary: Negative for dysuria, frequency, flank pain, vaginal bleeding, vaginal discharge, difficulty urinating, vaginal pain and pelvic pain.  Musculoskeletal: Positive for back pain. Negative for neck  pain.  Neurological: Negative for dizziness, weakness and headaches.  Psychiatric/Behavioral: Negative.      I have reviewed patient's Past Medical Hx, Surgical Hx, Family Hx, Social Hx, medications and allergies.   Physical Exam  Patient Vitals for the past 24 hrs:  BP Temp Pulse Resp  08/26/15 1940 146/86 mmHg - 68 -  08/26/15 1745 160/77 mmHg 97.6 F (36.4 C) 67 18   Constitutional: Well-developed, well-nourished female in no acute distress.  Cardiovascular: normal rate Respiratory:  normal effort GI: Abd soft, non-tender. Pos BS x 4 MS: Extremities nontender, no edema, normal ROM Neurologic: Alert and oriented x 4.  GU: Neg CVAT.  PELVIC EXAM: Cervix pink, visually closed, without lesion, scant white creamy discharge, Mirena string visible, vaginal walls and external genitalia normal Bimanual exam: Cervix 0/long/high, firm, anterior, neg CMT, uterus nontender, nonenlarged, adnexa without tenderness, enlargement, or mass   LAB RESULTS Results for orders placed or performed during the hospital encounter of 08/26/15 (from the past 24 hour(s))  Urinalysis, Routine w reflex microscopic (not at [facility])     Status: Abnormal   Collection Time: 08/26/15  5:54 PM  Result Value Ref Range   Color, Urine YELLOW YELLOW   APPearance CLEAR CLEAR   Specific Gravity, Urine 1.020 1.005 - 1.030   pH 5.5 5.0 - 8.0   Glucose, UA NEGATIVE NEGATIVE mg/dL   Hgb urine dipstick LARGE (A) NEGATIVE   Bilirubin Urine NEGATIVE NEGATIVE   Ketones, ur 15 (A) NEGATIVE mg/dL   Protein, ur NEGATIVE NEGATIVE mg/dL   Urobilinogen, UA 0.2 0.0 - 1.0 mg/dL   Nitrite NEGATIVE NEGATIVE   Leukocytes, UA NEGATIVE NEGATIVE  Urine microscopic-add on     Status: Abnormal   Collection Time: 08/26/15  5:54 PM  Result Value Ref Range   Squamous Epithelial / LPF RARE RARE   RBC / HPF TOO NUMEROUS TO COUNT <3 RBC/hpf   Bacteria, UA FEW (A) RARE  Pregnancy, urine POC     Status: None   Collection Time: 08/26/15  5:57 PM  Result Value Ref Range   Preg Test, Ur NEGATIVE NEGATIVE  Wet prep, genital     Status: Abnormal   Collection Time: 08/26/15  6:55 PM  Result Value Ref Range   Yeast Wet Prep HPF POC NONE SEEN NONE SEEN   Trich, Wet Prep NONE SEEN NONE SEEN   Clue Cells Wet Prep HPF POC NONE SEEN NONE SEEN   WBC, Wet Prep HPF POC FEW (A) NONE SEEN  CBC     Status: Abnormal   Collection Time: 08/26/15  6:59 PM  Result Value Ref Range   WBC 11.3 (H) 4.0 - 10.5 K/uL   RBC 4.72 3.87 - 5.11 MIL/uL    Hemoglobin 14.2 12.0 - 15.0 g/dL   HCT 42.2 36.0 - 46.0 %   MCV 89.4 78.0 - 100.0 fL   MCH 30.1 26.0 - 34.0 pg   MCHC 33.6 30.0 - 36.0 g/dL   RDW 12.5 11.5 - 15.5 %   Platelets 210 150 - 400 K/uL       IMAGING No results found.  MAU Management/MDM: Ordered labs and reviewed results.  Consult Dr Gaetano Net. With complete resolution of symptoms and large hemoglobin in urine, likely cause of pain is resolved renal stone.  Pt stable at time of discharge.  ASSESSMENT 1. Kidney stone   2. RLQ abdominal pain     PLAN Discharge home Urine sent for culture    Medication List  TAKE these medications        albuterol 108 (90 BASE) MCG/ACT inhaler  Commonly known as:  PROVENTIL HFA;VENTOLIN HFA  Inhale 2 puffs into the lungs every 6 (six) hours as needed. For shortness of breath     mometasone 220 MCG/INH inhaler  Commonly known as:  ASMANEX  Inhale 2 puffs into the lungs daily as needed.     oxyCODONE-acetaminophen 5-325 MG tablet  Commonly known as:  ROXICET  Take 1 tablet by mouth every 4 (four) hours as needed for pain.           Follow-up Information    Follow up with LOWE,DAVID C, MD.   Specialty:  Obstetrics and Gynecology   Why:  As needed   Contact information:   [place], [place] [address] [phone]       Follow up with [facility].   Why:  As needed for emergencies   Contact information:   [address] [id number] [place] Certified Nurse-Midwife 08/26/2015  7:44 PM

## 2015-08-27 LAB — URINE CULTURE: Culture: 5000

## 2015-08-27 LAB — GC/CHLAMYDIA PROBE AMP (~~LOC~~) NOT AT ARMC
CHLAMYDIA, DNA PROBE: NEGATIVE
Neisseria Gonorrhea: NEGATIVE

## 2015-08-27 LAB — HIV ANTIBODY (ROUTINE TESTING W REFLEX): HIV SCREEN 4TH GENERATION: NONREACTIVE

## 2015-10-04 ENCOUNTER — Other Ambulatory Visit: Payer: Self-pay

## 2015-10-05 ENCOUNTER — Other Ambulatory Visit: Payer: Self-pay | Admitting: Obstetrics and Gynecology

## 2015-10-05 DIAGNOSIS — Z803 Family history of malignant neoplasm of breast: Secondary | ICD-10-CM

## 2015-10-05 DIAGNOSIS — N6321 Unspecified lump in the left breast, upper outer quadrant: Secondary | ICD-10-CM

## 2015-10-06 ENCOUNTER — Ambulatory Visit
Admission: RE | Admit: 2015-10-06 | Discharge: 2015-10-06 | Disposition: A | Discharge status: Home / Self Care | Payer: BLUE CROSS/BLUE SHIELD | Source: Ambulatory Visit | Attending: Obstetrics and Gynecology | Admitting: Obstetrics and Gynecology

## 2015-10-06 DIAGNOSIS — N6321 Unspecified lump in the left breast, upper outer quadrant: Secondary | ICD-10-CM

## 2015-10-06 DIAGNOSIS — Z803 Family history of malignant neoplasm of breast: Secondary | ICD-10-CM

## 2016-02-08 ENCOUNTER — Other Ambulatory Visit: Payer: Self-pay

## 2016-02-08 DIAGNOSIS — Z1231 Encounter for screening mammogram for malignant neoplasm of breast: Secondary | ICD-10-CM

## 2016-02-09 ENCOUNTER — Other Ambulatory Visit: Payer: Self-pay | Admitting: Obstetrics and Gynecology

## 2016-02-09 DIAGNOSIS — Z803 Family history of malignant neoplasm of breast: Secondary | ICD-10-CM

## 2016-02-17 ENCOUNTER — Ambulatory Visit
Admission: RE | Admit: 2016-02-17 | Discharge: 2016-02-17 | Disposition: A | Discharge status: Home / Self Care | Payer: BLUE CROSS/BLUE SHIELD | Source: Ambulatory Visit

## 2016-02-17 DIAGNOSIS — Z1231 Encounter for screening mammogram for malignant neoplasm of breast: Secondary | ICD-10-CM

## 2016-03-14 ENCOUNTER — Ambulatory Visit
Admission: RE | Admit: 2016-03-14 | Discharge: 2016-03-14 | Disposition: A | Discharge status: Home / Self Care | Payer: BLUE CROSS/BLUE SHIELD | Source: Ambulatory Visit | Attending: Obstetrics and Gynecology | Admitting: Obstetrics and Gynecology

## 2016-03-14 DIAGNOSIS — Z803 Family history of malignant neoplasm of breast: Secondary | ICD-10-CM

## 2016-03-14 DIAGNOSIS — N63 Unspecified lump in breast: Secondary | ICD-10-CM | POA: Diagnosis not present

## 2016-03-14 MED ORDER — GADOBENATE DIMEGLUMINE 529 MG/ML IV SOLN
13.0000 mL | Freq: Once | INTRAVENOUS | Status: AC | PRN
  Administered 2016-03-14: 13 mL via INTRAVENOUS

## 2016-08-15 DIAGNOSIS — L821 Other seborrheic keratosis: Secondary | ICD-10-CM | POA: Diagnosis not present

## 2016-08-15 DIAGNOSIS — L570 Actinic keratosis: Secondary | ICD-10-CM | POA: Diagnosis not present

## 2016-08-15 DIAGNOSIS — L814 Other melanin hyperpigmentation: Secondary | ICD-10-CM | POA: Diagnosis not present

## 2016-09-03 DIAGNOSIS — N3001 Acute cystitis with hematuria: Secondary | ICD-10-CM | POA: Diagnosis not present

## 2016-09-17 DIAGNOSIS — R319 Hematuria, unspecified: Secondary | ICD-10-CM | POA: Diagnosis not present

## 2016-09-21 DIAGNOSIS — R310 Gross hematuria: Secondary | ICD-10-CM | POA: Diagnosis not present

## 2016-09-21 DIAGNOSIS — R319 Hematuria, unspecified: Secondary | ICD-10-CM | POA: Diagnosis not present

## 2017-01-08 ENCOUNTER — Other Ambulatory Visit: Payer: Self-pay | Admitting: Obstetrics and Gynecology

## 2017-01-08 DIAGNOSIS — Z1231 Encounter for screening mammogram for malignant neoplasm of breast: Secondary | ICD-10-CM

## 2017-02-15 DIAGNOSIS — L578 Other skin changes due to chronic exposure to nonionizing radiation: Secondary | ICD-10-CM | POA: Diagnosis not present

## 2017-02-15 DIAGNOSIS — L720 Epidermal cyst: Secondary | ICD-10-CM | POA: Diagnosis not present

## 2017-02-15 DIAGNOSIS — L814 Other melanin hyperpigmentation: Secondary | ICD-10-CM | POA: Diagnosis not present

## 2017-02-15 DIAGNOSIS — L820 Inflamed seborrheic keratosis: Secondary | ICD-10-CM | POA: Diagnosis not present

## 2017-02-15 DIAGNOSIS — D225 Melanocytic nevi of trunk: Secondary | ICD-10-CM | POA: Diagnosis not present

## 2017-02-27 ENCOUNTER — Other Ambulatory Visit: Payer: Self-pay | Admitting: Obstetrics and Gynecology

## 2017-02-27 DIAGNOSIS — Z6825 Body mass index (BMI) 25.0-25.9, adult: Secondary | ICD-10-CM | POA: Diagnosis not present

## 2017-02-27 DIAGNOSIS — N644 Mastodynia: Secondary | ICD-10-CM

## 2017-02-27 DIAGNOSIS — Z30431 Encounter for routine checking of intrauterine contraceptive device: Secondary | ICD-10-CM | POA: Diagnosis not present

## 2017-02-27 DIAGNOSIS — Z01419 Encounter for gynecological examination (general) (routine) without abnormal findings: Secondary | ICD-10-CM | POA: Diagnosis not present

## 2017-03-01 ENCOUNTER — Ambulatory Visit: Payer: BLUE CROSS/BLUE SHIELD

## 2017-03-02 ENCOUNTER — Other Ambulatory Visit: Payer: BLUE CROSS/BLUE SHIELD

## 2017-03-16 ENCOUNTER — Ambulatory Visit
Admission: RE | Admit: 2017-03-16 | Discharge: 2017-03-16 | Disposition: A | Discharge status: Home / Self Care | Payer: BLUE CROSS/BLUE SHIELD | Source: Ambulatory Visit | Attending: Obstetrics and Gynecology | Admitting: Obstetrics and Gynecology

## 2017-03-16 ENCOUNTER — Encounter: Payer: Self-pay | Admitting: Radiology

## 2017-03-16 DIAGNOSIS — N644 Mastodynia: Secondary | ICD-10-CM

## 2017-03-16 DIAGNOSIS — R928 Other abnormal and inconclusive findings on diagnostic imaging of breast: Secondary | ICD-10-CM | POA: Diagnosis not present

## 2017-03-16 DIAGNOSIS — N6012 Diffuse cystic mastopathy of left breast: Secondary | ICD-10-CM | POA: Diagnosis not present

## 2017-04-19 ENCOUNTER — Other Ambulatory Visit: Payer: Self-pay | Admitting: Obstetrics and Gynecology

## 2017-04-19 DIAGNOSIS — Z1501 Genetic susceptibility to malignant neoplasm of breast: Secondary | ICD-10-CM

## 2017-04-19 DIAGNOSIS — Z1509 Genetic susceptibility to other malignant neoplasm: Principal | ICD-10-CM

## 2017-05-07 ENCOUNTER — Ambulatory Visit
Admission: RE | Admit: 2017-05-07 | Discharge: 2017-05-07 | Disposition: A | Discharge status: Home / Self Care | Payer: BLUE CROSS/BLUE SHIELD | Source: Ambulatory Visit | Attending: Obstetrics and Gynecology | Admitting: Obstetrics and Gynecology

## 2017-05-07 DIAGNOSIS — Z1501 Genetic susceptibility to malignant neoplasm of breast: Secondary | ICD-10-CM

## 2017-05-07 DIAGNOSIS — Z1509 Genetic susceptibility to other malignant neoplasm: Principal | ICD-10-CM

## 2017-05-07 DIAGNOSIS — D241 Benign neoplasm of right breast: Secondary | ICD-10-CM | POA: Diagnosis not present

## 2017-05-07 MED ORDER — GADOBENATE DIMEGLUMINE 529 MG/ML IV SOLN
15.0000 mL | Freq: Once | INTRAVENOUS | Status: AC | PRN
  Administered 2017-05-07: 12 mL via INTRAVENOUS

## 2017-05-11 DIAGNOSIS — E784 Other hyperlipidemia: Secondary | ICD-10-CM | POA: Diagnosis not present

## 2017-05-11 DIAGNOSIS — Z0000 Encounter for general adult medical examination without abnormal findings: Secondary | ICD-10-CM | POA: Diagnosis not present

## 2017-05-15 DIAGNOSIS — Z1389 Encounter for screening for other disorder: Secondary | ICD-10-CM | POA: Diagnosis not present

## 2017-05-15 DIAGNOSIS — J302 Other seasonal allergic rhinitis: Secondary | ICD-10-CM | POA: Diagnosis not present

## 2017-05-15 DIAGNOSIS — Z0000 Encounter for general adult medical examination without abnormal findings: Secondary | ICD-10-CM | POA: Diagnosis not present

## 2017-05-15 DIAGNOSIS — Z23 Encounter for immunization: Secondary | ICD-10-CM | POA: Diagnosis not present

## 2017-05-15 DIAGNOSIS — E784 Other hyperlipidemia: Secondary | ICD-10-CM | POA: Diagnosis not present

## 2017-05-15 DIAGNOSIS — J45909 Unspecified asthma, uncomplicated: Secondary | ICD-10-CM | POA: Diagnosis not present

## 2017-05-15 DIAGNOSIS — Z803 Family history of malignant neoplasm of breast: Secondary | ICD-10-CM | POA: Diagnosis not present

## 2017-06-04 DIAGNOSIS — N390 Urinary tract infection, site not specified: Secondary | ICD-10-CM | POA: Diagnosis not present

## 2017-06-04 DIAGNOSIS — R300 Dysuria: Secondary | ICD-10-CM | POA: Diagnosis not present

## 2017-06-11 DIAGNOSIS — R300 Dysuria: Secondary | ICD-10-CM | POA: Diagnosis not present

## 2017-07-28 DIAGNOSIS — Z23 Encounter for immunization: Secondary | ICD-10-CM | POA: Diagnosis not present

## 2017-07-28 DIAGNOSIS — R030 Elevated blood-pressure reading, without diagnosis of hypertension: Secondary | ICD-10-CM | POA: Diagnosis not present

## 2017-09-20 DIAGNOSIS — L578 Other skin changes due to chronic exposure to nonionizing radiation: Secondary | ICD-10-CM | POA: Diagnosis not present

## 2017-11-02 DIAGNOSIS — R300 Dysuria: Secondary | ICD-10-CM | POA: Diagnosis not present

## 2017-11-02 DIAGNOSIS — Z1212 Encounter for screening for malignant neoplasm of rectum: Secondary | ICD-10-CM | POA: Diagnosis not present

## 2017-11-02 DIAGNOSIS — N390 Urinary tract infection, site not specified: Secondary | ICD-10-CM | POA: Diagnosis not present

## 2017-11-02 DIAGNOSIS — R8290 Unspecified abnormal findings in urine: Secondary | ICD-10-CM | POA: Diagnosis not present

## 2017-11-02 DIAGNOSIS — I10 Essential (primary) hypertension: Secondary | ICD-10-CM | POA: Diagnosis not present

## 2017-11-02 DIAGNOSIS — H9313 Tinnitus, bilateral: Secondary | ICD-10-CM | POA: Diagnosis not present

## 2017-11-02 DIAGNOSIS — Z6823 Body mass index (BMI) 23.0-23.9, adult: Secondary | ICD-10-CM | POA: Diagnosis not present

## 2017-11-19 DIAGNOSIS — N390 Urinary tract infection, site not specified: Secondary | ICD-10-CM | POA: Diagnosis not present

## 2017-11-19 DIAGNOSIS — R300 Dysuria: Secondary | ICD-10-CM | POA: Diagnosis not present

## 2017-11-19 DIAGNOSIS — R8290 Unspecified abnormal findings in urine: Secondary | ICD-10-CM | POA: Diagnosis not present

## 2017-12-24 DIAGNOSIS — Z6823 Body mass index (BMI) 23.0-23.9, adult: Secondary | ICD-10-CM | POA: Diagnosis not present

## 2017-12-24 DIAGNOSIS — I10 Essential (primary) hypertension: Secondary | ICD-10-CM | POA: Diagnosis not present

## 2017-12-24 DIAGNOSIS — M545 Low back pain: Secondary | ICD-10-CM | POA: Diagnosis not present

## 2018-01-01 DIAGNOSIS — D1801 Hemangioma of skin and subcutaneous tissue: Secondary | ICD-10-CM | POA: Diagnosis not present

## 2018-01-01 DIAGNOSIS — L570 Actinic keratosis: Secondary | ICD-10-CM | POA: Diagnosis not present

## 2018-01-01 DIAGNOSIS — L578 Other skin changes due to chronic exposure to nonionizing radiation: Secondary | ICD-10-CM | POA: Diagnosis not present

## 2018-01-01 DIAGNOSIS — D225 Melanocytic nevi of trunk: Secondary | ICD-10-CM | POA: Diagnosis not present

## 2018-01-01 DIAGNOSIS — D485 Neoplasm of uncertain behavior of skin: Secondary | ICD-10-CM | POA: Diagnosis not present

## 2018-01-01 DIAGNOSIS — L821 Other seborrheic keratosis: Secondary | ICD-10-CM | POA: Diagnosis not present

## 2018-01-10 DIAGNOSIS — N6320 Unspecified lump in the left breast, unspecified quadrant: Secondary | ICD-10-CM | POA: Diagnosis not present

## 2018-01-11 ENCOUNTER — Other Ambulatory Visit: Payer: Self-pay | Admitting: Obstetrics and Gynecology

## 2018-01-11 DIAGNOSIS — N6320 Unspecified lump in the left breast, unspecified quadrant: Secondary | ICD-10-CM

## 2018-01-16 ENCOUNTER — Ambulatory Visit
Admission: RE | Admit: 2018-01-16 | Discharge: 2018-01-16 | Disposition: A | Discharge status: Home / Self Care | Payer: BLUE CROSS/BLUE SHIELD | Source: Ambulatory Visit | Attending: Obstetrics and Gynecology | Admitting: Obstetrics and Gynecology

## 2018-01-16 DIAGNOSIS — R922 Inconclusive mammogram: Secondary | ICD-10-CM | POA: Diagnosis not present

## 2018-01-16 DIAGNOSIS — N6002 Solitary cyst of left breast: Secondary | ICD-10-CM | POA: Diagnosis not present

## 2018-01-16 DIAGNOSIS — N6320 Unspecified lump in the left breast, unspecified quadrant: Secondary | ICD-10-CM

## 2018-01-29 DIAGNOSIS — I10 Essential (primary) hypertension: Secondary | ICD-10-CM | POA: Diagnosis not present

## 2018-01-29 DIAGNOSIS — F439 Reaction to severe stress, unspecified: Secondary | ICD-10-CM | POA: Diagnosis not present

## 2018-02-28 DIAGNOSIS — Z6823 Body mass index (BMI) 23.0-23.9, adult: Secondary | ICD-10-CM | POA: Diagnosis not present

## 2018-02-28 DIAGNOSIS — Z1231 Encounter for screening mammogram for malignant neoplasm of breast: Secondary | ICD-10-CM | POA: Diagnosis not present

## 2018-02-28 DIAGNOSIS — Z800 Family history of malignant neoplasm of digestive organs: Secondary | ICD-10-CM | POA: Diagnosis not present

## 2018-02-28 DIAGNOSIS — Z01419 Encounter for gynecological examination (general) (routine) without abnormal findings: Secondary | ICD-10-CM | POA: Diagnosis not present

## 2018-02-28 DIAGNOSIS — Z808 Family history of malignant neoplasm of other organs or systems: Secondary | ICD-10-CM | POA: Diagnosis not present

## 2018-02-28 DIAGNOSIS — Z803 Family history of malignant neoplasm of breast: Secondary | ICD-10-CM | POA: Diagnosis not present

## 2018-03-01 DIAGNOSIS — N3001 Acute cystitis with hematuria: Secondary | ICD-10-CM | POA: Diagnosis not present

## 2018-03-01 DIAGNOSIS — R300 Dysuria: Secondary | ICD-10-CM | POA: Diagnosis not present

## 2018-03-20 ENCOUNTER — Other Ambulatory Visit: Payer: Self-pay | Admitting: Obstetrics and Gynecology

## 2018-03-20 DIAGNOSIS — Z803 Family history of malignant neoplasm of breast: Secondary | ICD-10-CM

## 2018-04-12 DIAGNOSIS — Z30433 Encounter for removal and reinsertion of intrauterine contraceptive device: Secondary | ICD-10-CM | POA: Diagnosis not present

## 2018-04-12 DIAGNOSIS — Z3202 Encounter for pregnancy test, result negative: Secondary | ICD-10-CM | POA: Diagnosis not present

## 2018-04-29 DIAGNOSIS — Z6824 Body mass index (BMI) 24.0-24.9, adult: Secondary | ICD-10-CM | POA: Diagnosis not present

## 2018-04-29 DIAGNOSIS — I10 Essential (primary) hypertension: Secondary | ICD-10-CM | POA: Diagnosis not present

## 2018-05-13 ENCOUNTER — Ambulatory Visit
Admission: RE | Admit: 2018-05-13 | Discharge: 2018-05-13 | Disposition: A | Discharge status: Home / Self Care | Payer: BLUE CROSS/BLUE SHIELD | Source: Ambulatory Visit | Attending: Obstetrics and Gynecology | Admitting: Obstetrics and Gynecology

## 2018-05-13 DIAGNOSIS — Z803 Family history of malignant neoplasm of breast: Secondary | ICD-10-CM

## 2018-05-13 MED ORDER — GADOBENATE DIMEGLUMINE 529 MG/ML IV SOLN
13.0000 mL | Freq: Once | INTRAVENOUS | Status: AC | PRN
  Administered 2018-05-13: 13 mL via INTRAVENOUS

## 2018-06-03 DIAGNOSIS — Z809 Family history of malignant neoplasm, unspecified: Secondary | ICD-10-CM | POA: Diagnosis not present

## 2018-06-03 DIAGNOSIS — N939 Abnormal uterine and vaginal bleeding, unspecified: Secondary | ICD-10-CM | POA: Diagnosis not present

## 2018-06-24 DIAGNOSIS — R82998 Other abnormal findings in urine: Secondary | ICD-10-CM | POA: Diagnosis not present

## 2018-06-24 DIAGNOSIS — I10 Essential (primary) hypertension: Secondary | ICD-10-CM | POA: Diagnosis not present

## 2018-06-24 DIAGNOSIS — E7849 Other hyperlipidemia: Secondary | ICD-10-CM | POA: Diagnosis not present

## 2018-07-01 DIAGNOSIS — Z1389 Encounter for screening for other disorder: Secondary | ICD-10-CM | POA: Diagnosis not present

## 2018-07-01 DIAGNOSIS — J45909 Unspecified asthma, uncomplicated: Secondary | ICD-10-CM | POA: Diagnosis not present

## 2018-07-01 DIAGNOSIS — E7849 Other hyperlipidemia: Secondary | ICD-10-CM | POA: Diagnosis not present

## 2018-07-01 DIAGNOSIS — Z0000 Encounter for general adult medical examination without abnormal findings: Secondary | ICD-10-CM | POA: Diagnosis not present

## 2018-07-01 DIAGNOSIS — M545 Low back pain: Secondary | ICD-10-CM | POA: Diagnosis not present

## 2018-07-01 DIAGNOSIS — I10 Essential (primary) hypertension: Secondary | ICD-10-CM | POA: Diagnosis not present

## 2018-07-02 DIAGNOSIS — L578 Other skin changes due to chronic exposure to nonionizing radiation: Secondary | ICD-10-CM | POA: Diagnosis not present

## 2018-07-05 DIAGNOSIS — Z1212 Encounter for screening for malignant neoplasm of rectum: Secondary | ICD-10-CM | POA: Diagnosis not present

## 2018-07-12 ENCOUNTER — Encounter: Payer: Self-pay | Admitting: Gastroenterology

## 2018-08-30 ENCOUNTER — Ambulatory Visit (AMBULATORY_SURGERY_CENTER): Payer: Self-pay | Admitting: *Deleted

## 2018-08-30 ENCOUNTER — Encounter: Payer: Self-pay | Admitting: Gastroenterology

## 2018-08-30 VITALS — Ht 64.0 in | Wt 146.0 lb

## 2018-08-30 DIAGNOSIS — Z1211 Encounter for screening for malignant neoplasm of colon: Secondary | ICD-10-CM

## 2018-08-30 MED ORDER — SOD PICOSULFATE-MAG OX-CIT ACD 10-3.5-12 MG-GM -GM/160ML PO SOLN: 1.0000 | ORAL | 0 refills | Status: DC

## 2018-08-30 NOTE — Progress Notes (Signed)
No egg or soy allergy known to patient  No issues with past sedation with any surgeries  or procedures, no intubation problems  No diet pills per patient No home 02 use per patient  No blood thinners per patient  Pt denies issues with constipation  No A fib or A flutter  EMMI video sent to pt's e mail --  Clenpiq pay no more than $40 coupon today in [place]

## 2018-09-04 DIAGNOSIS — Z23 Encounter for immunization: Secondary | ICD-10-CM | POA: Diagnosis not present

## 2018-09-09 ENCOUNTER — Telehealth: Payer: Self-pay | Admitting: Gastroenterology

## 2018-09-09 NOTE — Telephone Encounter (Signed)
Pt returning your call. Please call her mobile

## 2018-09-09 NOTE — Telephone Encounter (Signed)
Pt called to inform that clenpiq needs PA. Her proc is scheduled on Friday 09/13/18. Pls call her.

## 2018-09-09 NOTE — Telephone Encounter (Signed)
Pharmacy notified to fill RX without PA. Patient notified that we do not do PA on preps and to use the coupon that was given. Pt understands.

## 2018-09-13 ENCOUNTER — Encounter: Payer: Self-pay | Admitting: Gastroenterology

## 2018-09-13 ENCOUNTER — Ambulatory Visit (AMBULATORY_SURGERY_CENTER): Payer: BLUE CROSS/BLUE SHIELD | Admitting: Gastroenterology

## 2018-09-13 VITALS — BP 147/65 | HR 61 | Temp 98.7°F | Resp 17 | Ht 64.0 in | Wt 146.0 lb

## 2018-09-13 DIAGNOSIS — Z1211 Encounter for screening for malignant neoplasm of colon: Secondary | ICD-10-CM

## 2018-09-13 MED ORDER — SODIUM CHLORIDE 0.9 % IV SOLN: 500.0000 mL | Freq: Once | INTRAVENOUS | Status: DC

## 2018-09-13 NOTE — Patient Instructions (Signed)
YOU HAD AN ENDOSCOPIC PROCEDURE TODAY AT [facility]:   Refer to the procedure report that was given to you for any specific questions about what was found during the examination.  If the procedure report does not answer your questions, please call your gastroenterologist to clarify.  If you requested that your care partner not be given the details of your procedure findings, then the procedure report has been included in a sealed envelope for you to review at your convenience later.  YOU SHOULD EXPECT: Some feelings of bloating in the abdomen. Passage of more gas than usual.  Walking can help get rid of the air that was put into your GI tract during the procedure and reduce the bloating. If you had a lower endoscopy (such as a colonoscopy or flexible sigmoidoscopy) you may notice spotting of blood in your stool or on the toilet paper. If you underwent a bowel prep for your procedure, you may not have a normal bowel movement for a few days.  Please Note:  You might notice some irritation and congestion in your nose or some drainage.  This is from the oxygen used during your procedure.  There is no need for concern and it should clear up in a day or so.  SYMPTOMS TO REPORT IMMEDIATELY:   Following lower endoscopy (colonoscopy or flexible sigmoidoscopy):  Excessive amounts of blood in the stool  Significant tenderness or worsening of abdominal pains  Swelling of the abdomen that is new, acute  Fever of 100F or higher   Following upper endoscopy (EGD)  Vomiting of blood or coffee ground material  New chest pain or pain under the shoulder blades  Painful or persistently difficult swallowing  New shortness of breath  Fever of 100F or higher  Black, tarry-looking stools  For urgent or emergent issues, a gastroenterologist can be reached at any hour by calling [phone].   DIET:  We do recommend a small meal at first, but then you may proceed to your regular diet.  Drink  plenty of fluids but you should avoid alcoholic beverages for 24 hours.  ACTIVITY:  You should plan to take it easy for the rest of today and you should NOT DRIVE or use heavy machinery until tomorrow (because of the sedation medicines used during the test).    FOLLOW UP: Our staff will call the number listed on your records the next business day following your procedure to check on you and address any questions or concerns that you may have regarding the information given to you following your procedure. If we do not reach you, we will leave a message.  However, if you are feeling well and you are not experiencing any problems, there is no need to return our call.  We will assume that you have returned to your regular daily activities without incident.  If any biopsies were taken you will be contacted by phone or by letter within the next 1-3 weeks.  Please call us at [phone] if you have not heard about the biopsies in 3 weeks.    SIGNATURES/CONFIDENTIALITY: You and/or your care partner have signed paperwork which will be entered into your electronic medical record.  These signatures attest to the fact that that the information above on your After Visit Summary has been reviewed and is understood.  Full responsibility of the confidentiality of this discharge information lies with you and/or your care-partner.  Hemorrhoid information given.  Recall colonoscopy in 10 years-2029

## 2018-09-13 NOTE — Progress Notes (Signed)
Pt's states no medical or surgical changes since previsit or office visit. 

## 2018-09-13 NOTE — Progress Notes (Signed)
Report to PACU, RN, vss, BBS= Clear.  

## 2018-09-13 NOTE — Op Note (Signed)
[facility] Patient Name: Crystal Maynard Procedure Date: 09/13/2018 10:01 AM MRN: [id number] Endoscopist: Jackquline Denmark , MD Age: 52 Referring MD:  Date of Birth: Oct 25, 1966 Gender: Female Account #: [id number] Procedure:                Colonoscopy Indications:              Screening for colorectal malignant neoplasm Medicines:                Monitored Anesthesia Care Procedure:                Pre-Anesthesia Assessment:                           - Prior to the procedure, a History and Physical                            was performed, and patient medications and                            allergies were reviewed. The patient's tolerance of                            previous anesthesia was also reviewed. The risks                            and benefits of the procedure and the sedation                            options and risks were discussed with the patient.                            All questions were answered, and informed consent                            was obtained. Prior Anticoagulants: The patient has                            taken no previous anticoagulant or antiplatelet                            agents. ASA Grade Assessment: I - A normal, healthy                            patient. After reviewing the risks and benefits,                            the patient was deemed in satisfactory condition to                            undergo the procedure.                           After obtaining informed consent, the colonoscope  was passed under direct vision. Throughout the                            procedure, the patient's blood pressure, pulse, and                            oxygen saturations were monitored continuously. The                            Model PCF-H190DL [id number]) scope was introduced                            through the anus and advanced to the 2 cm into the                            ileum. The colonoscopy was  performed with ease. The                            patient tolerated the procedure well. The quality                            of the bowel preparation was excellent. Scope In: 10:09:24 AM Scope Out: 10:21:59 AM Scope Withdrawal Time: 0 hours 10 minutes 3 seconds  Total Procedure Duration: 0 hours 12 minutes 35 seconds  Findings:                 Non-bleeding internal hemorrhoids were found during                            retroflexion. The hemorrhoids were small.                           The exam was otherwise without abnormality on                            direct and retroflexion views. Complications:            No immediate complications. Estimated Blood Loss:     Estimated blood loss: none. Impression:               - Mimimal internal hemorrhoids.                           - The examination was otherwise normal on direct                            and retroflexion views.                           - No specimens collected. Recommendation:           - Patient has a contact number available for                            emergencies. The signs and symptoms of potential  delayed complications were discussed with the                            patient. Return to normal activities tomorrow.                            Written discharge instructions were provided to the                            patient.                           - Resume previous diet.                           - Continue present medications.                           - Repeat colonoscopy in 10 years for screening                            purposes. Earlier, if with any new problems or                            change in family history.                           - Return to GI clinic PRN. Jackquline Denmark, MD 09/13/2018 10:27:58 AM This report has been signed electronically.

## 2018-09-16 ENCOUNTER — Telehealth: Payer: Self-pay

## 2018-09-16 NOTE — Telephone Encounter (Signed)
  Follow up Call-  Call back number 09/13/2018  Post procedure Call Back phone  # [phone]  Permission to leave phone message Yes  Some recent data might be hidden     Patient questions:  Do you have a fever, pain , or abdominal swelling? No. Pain Score  0 *  Have you tolerated food without any problems? Yes.    Have you been able to return to your normal activities? Yes.    Do you have any questions about your discharge instructions: Diet   No. Medications  No. Follow up visit  No.  Do you have questions or concerns about your Care? No.  Actions: * If pain score is 4 or above: No action needed, pain <4.  No problems noted per pt. maw

## 2018-10-10 DIAGNOSIS — L578 Other skin changes due to chronic exposure to nonionizing radiation: Secondary | ICD-10-CM | POA: Diagnosis not present

## 2018-10-14 DIAGNOSIS — L578 Other skin changes due to chronic exposure to nonionizing radiation: Secondary | ICD-10-CM | POA: Diagnosis not present

## 2019-03-18 DIAGNOSIS — J302 Other seasonal allergic rhinitis: Secondary | ICD-10-CM | POA: Diagnosis not present

## 2019-03-18 DIAGNOSIS — I10 Essential (primary) hypertension: Secondary | ICD-10-CM | POA: Diagnosis not present

## 2019-03-18 DIAGNOSIS — J069 Acute upper respiratory infection, unspecified: Secondary | ICD-10-CM | POA: Diagnosis not present

## 2019-03-31 DIAGNOSIS — I10 Essential (primary) hypertension: Secondary | ICD-10-CM | POA: Diagnosis not present

## 2019-04-01 DIAGNOSIS — J45909 Unspecified asthma, uncomplicated: Secondary | ICD-10-CM | POA: Diagnosis not present

## 2019-04-01 DIAGNOSIS — I10 Essential (primary) hypertension: Secondary | ICD-10-CM | POA: Diagnosis not present

## 2019-04-01 DIAGNOSIS — J069 Acute upper respiratory infection, unspecified: Secondary | ICD-10-CM | POA: Diagnosis not present

## 2019-04-03 DIAGNOSIS — Z1231 Encounter for screening mammogram for malignant neoplasm of breast: Secondary | ICD-10-CM | POA: Diagnosis not present

## 2019-04-03 DIAGNOSIS — J45909 Unspecified asthma, uncomplicated: Secondary | ICD-10-CM | POA: Insufficient documentation

## 2019-04-03 DIAGNOSIS — I10 Essential (primary) hypertension: Secondary | ICD-10-CM | POA: Insufficient documentation

## 2019-04-03 DIAGNOSIS — Z01419 Encounter for gynecological examination (general) (routine) without abnormal findings: Secondary | ICD-10-CM | POA: Diagnosis not present

## 2019-04-03 DIAGNOSIS — Z6824 Body mass index (BMI) 24.0-24.9, adult: Secondary | ICD-10-CM | POA: Diagnosis not present

## 2019-04-10 ENCOUNTER — Other Ambulatory Visit: Payer: Self-pay | Admitting: Obstetrics and Gynecology

## 2019-04-10 DIAGNOSIS — Z803 Family history of malignant neoplasm of breast: Secondary | ICD-10-CM

## 2019-05-21 HISTORY — PX: BREAST BIOPSY: SHX20

## 2019-06-02 ENCOUNTER — Ambulatory Visit
Admission: RE | Admit: 2019-06-02 | Discharge: 2019-06-02 | Disposition: A | Discharge status: Home / Self Care | Payer: BC Managed Care – PPO | Source: Ambulatory Visit | Attending: Obstetrics and Gynecology | Admitting: Obstetrics and Gynecology

## 2019-06-02 DIAGNOSIS — N6322 Unspecified lump in the left breast, upper inner quadrant: Secondary | ICD-10-CM | POA: Diagnosis not present

## 2019-06-02 DIAGNOSIS — Z803 Family history of malignant neoplasm of breast: Secondary | ICD-10-CM

## 2019-06-02 DIAGNOSIS — Z853 Personal history of malignant neoplasm of breast: Secondary | ICD-10-CM | POA: Diagnosis not present

## 2019-06-02 DIAGNOSIS — N6321 Unspecified lump in the left breast, upper outer quadrant: Secondary | ICD-10-CM | POA: Diagnosis not present

## 2019-06-02 MED ORDER — GADOBUTROL 1 MMOL/ML IV SOLN
7.0000 mL | Freq: Once | INTRAVENOUS | Status: AC | PRN
  Administered 2019-06-02: 7 mL via INTRAVENOUS

## 2019-06-06 ENCOUNTER — Other Ambulatory Visit: Payer: Self-pay | Admitting: Obstetrics and Gynecology

## 2019-06-06 DIAGNOSIS — R9389 Abnormal findings on diagnostic imaging of other specified body structures: Secondary | ICD-10-CM

## 2019-06-16 ENCOUNTER — Ambulatory Visit
Admission: RE | Admit: 2019-06-16 | Discharge: 2019-06-16 | Disposition: A | Discharge status: Home / Self Care | Payer: BC Managed Care – PPO | Source: Ambulatory Visit | Attending: Obstetrics and Gynecology | Admitting: Obstetrics and Gynecology

## 2019-06-16 ENCOUNTER — Other Ambulatory Visit: Payer: Self-pay

## 2019-06-16 DIAGNOSIS — N6012 Diffuse cystic mastopathy of left breast: Secondary | ICD-10-CM | POA: Diagnosis not present

## 2019-06-16 DIAGNOSIS — R9389 Abnormal findings on diagnostic imaging of other specified body structures: Secondary | ICD-10-CM

## 2019-06-16 DIAGNOSIS — N6325 Unspecified lump in the left breast, overlapping quadrants: Secondary | ICD-10-CM | POA: Diagnosis not present

## 2019-06-16 MED ORDER — GADOBUTROL 1 MMOL/ML IV SOLN
5.0000 mL | Freq: Once | INTRAVENOUS | Status: AC | PRN
  Administered 2019-06-16: 10:00:00 5 mL via INTRAVENOUS

## 2019-06-26 DIAGNOSIS — Z0000 Encounter for general adult medical examination without abnormal findings: Secondary | ICD-10-CM | POA: Diagnosis not present

## 2019-06-26 DIAGNOSIS — I10 Essential (primary) hypertension: Secondary | ICD-10-CM | POA: Diagnosis not present

## 2019-06-30 DIAGNOSIS — I10 Essential (primary) hypertension: Secondary | ICD-10-CM | POA: Diagnosis not present

## 2019-06-30 DIAGNOSIS — R82998 Other abnormal findings in urine: Secondary | ICD-10-CM | POA: Diagnosis not present

## 2019-07-03 DIAGNOSIS — J45909 Unspecified asthma, uncomplicated: Secondary | ICD-10-CM | POA: Diagnosis not present

## 2019-07-03 DIAGNOSIS — Z1331 Encounter for screening for depression: Secondary | ICD-10-CM | POA: Diagnosis not present

## 2019-07-03 DIAGNOSIS — Z803 Family history of malignant neoplasm of breast: Secondary | ICD-10-CM | POA: Diagnosis not present

## 2019-07-03 DIAGNOSIS — E785 Hyperlipidemia, unspecified: Secondary | ICD-10-CM | POA: Diagnosis not present

## 2019-07-03 DIAGNOSIS — Z0000 Encounter for general adult medical examination without abnormal findings: Secondary | ICD-10-CM | POA: Diagnosis not present

## 2019-07-03 DIAGNOSIS — I10 Essential (primary) hypertension: Secondary | ICD-10-CM | POA: Diagnosis not present

## 2019-08-09 DIAGNOSIS — Z23 Encounter for immunization: Secondary | ICD-10-CM | POA: Diagnosis not present

## 2019-09-02 ENCOUNTER — Ambulatory Visit (INDEPENDENT_AMBULATORY_CARE_PROVIDER_SITE_OTHER): Payer: BC Managed Care – PPO

## 2019-09-02 ENCOUNTER — Ambulatory Visit: Payer: BC Managed Care – PPO | Admitting: Podiatry

## 2019-09-02 ENCOUNTER — Encounter: Payer: Self-pay | Admitting: Podiatry

## 2019-09-02 ENCOUNTER — Other Ambulatory Visit: Payer: Self-pay | Admitting: Podiatry

## 2019-09-02 ENCOUNTER — Other Ambulatory Visit: Payer: Self-pay

## 2019-09-02 VITALS — BP 144/84 | HR 78 | Resp 16

## 2019-09-02 DIAGNOSIS — G5762 Lesion of plantar nerve, left lower limb: Secondary | ICD-10-CM

## 2019-09-02 DIAGNOSIS — M778 Other enthesopathies, not elsewhere classified: Secondary | ICD-10-CM

## 2019-09-02 DIAGNOSIS — G5782 Other specified mononeuropathies of left lower limb: Secondary | ICD-10-CM

## 2019-09-02 DIAGNOSIS — G5761 Lesion of plantar nerve, right lower limb: Secondary | ICD-10-CM

## 2019-09-02 NOTE — Progress Notes (Signed)
  Subjective:  Patient ID: Crystal Maynard, female    DOB: 29-Jul-1966,  MRN: [id number] HPI Chief Complaint  Patient presents with  . Foot Pain    Forefoot (3rd interspace) left - patient was treated 20 years ago for a "swollen nerve", has worn orthotics for years and foot has not been an issue until about 3 weeks ago starting feeling sharp, shooting pains  . New Patient (Initial Visit)    53 y.o. female presents with the above complaint.   ROS: Denies fever chills nausea vomiting muscle aches pains calf pain back pain chest pain shortness of breath.  Past Medical History:  Diagnosis Date  . Allergy   . Asthma   . Breast mass    left breast  . Chronic kidney disease    kidney stones  . Family history of malignant neoplasm of breast   . Hypertension    Past Surgical History:  Procedure Laterality Date  . BREAST BIOPSY Right 02/18/2015  . BREAST BIOPSY Right 07/07/2013  . BREAST BIOPSY  04/01/2012  . BREAST LUMPECTOMY  08/09/2012   Procedure: LUMPECTOMY;  Surgeon: Ralene Ok, MD;  Location: [facility];  Service: General;  Laterality: Left;  . DILATION AND CURETTAGE OF UTERUS    . HAND SURGERY     3 surgeries on left hand    Current Outpatient Medications:  .  cetirizine (ZYRTEC) 10 MG tablet, Take 10 mg by mouth daily., Disp: , Rfl:  .  albuterol (PROVENTIL HFA;VENTOLIN HFA) 108 (90 BASE) MCG/ACT inhaler, Inhale 2 puffs into the lungs every 6 (six) hours as needed. For shortness of breath, Disp: , Rfl:  .  mometasone (ASMANEX) 220 MCG/INH inhaler, Inhale 2 puffs into the lungs daily as needed. , Disp: , Rfl:  .  olmesartan (BENICAR) 20 MG tablet, Take 20 mg by mouth daily., Disp: , Rfl:   Allergies  Allergen Reactions  . Amoxicillin-Pot Clavulanate Rash    Itchy rash    Review of Systems Objective:   Vitals:   09/02/19 0830  BP: (!) 144/84  Pulse: 78  Resp: 16    General: Well developed, nourished, in no acute distress, alert and oriented x3   Dermatological:  Skin is warm, dry and supple bilateral. Nails x 10 are well maintained; remaining integument appears unremarkable at this time. There are no open sores, no preulcerative lesions, no rash or signs of infection present.  Vascular: Dorsalis Pedis artery and Posterior Tibial artery pedal pulses are 2/4 bilateral with immedate capillary fill time. Pedal hair growth present. No varicosities and no lower extremity edema present bilateral.   Neruologic: Grossly intact via light touch bilateral. Vibratory intact via tuning fork bilateral. Protective threshold with Semmes Wienstein monofilament intact to all pedal sites bilateral. Patellar and Achilles deep tendon reflexes 2+ bilateral. No Babinski or clonus noted bilateral.   Musculoskeletal: No gross boney pedal deformities bilateral. No pain, crepitus, or limitation noted with foot and ankle range of motion bilateral. Muscular strength 5/5 in all groups tested bilateral.  Gait: Unassisted, Nonantalgic.    Radiographs:  Radiographs taken today demonstrate an osseously mature individual.  No acute findings.  Assessment & Plan:   Assessment: Neuroma third interdigital space left foot  Plan: She needs a new pair of orthotics and she had an injection to the third interdigital space of the left foot 20 mg Kenalog 5 mg Marcaine.  Tolerated the procedure well follow-up with her in 1 month     Crystal Maynard T. [place], [place]

## 2019-09-30 ENCOUNTER — Ambulatory Visit: Payer: BC Managed Care – PPO | Admitting: Orthotics

## 2019-09-30 ENCOUNTER — Other Ambulatory Visit: Payer: Self-pay

## 2019-09-30 DIAGNOSIS — G5762 Lesion of plantar nerve, left lower limb: Secondary | ICD-10-CM

## 2019-09-30 DIAGNOSIS — G5761 Lesion of plantar nerve, right lower limb: Secondary | ICD-10-CM

## 2019-09-30 DIAGNOSIS — G5782 Other specified mononeuropathies of left lower limb: Secondary | ICD-10-CM

## 2019-09-30 NOTE — Progress Notes (Signed)
Patient came in today to pick up custom made foot orthotics.  The goals were accomplished and the patient reported no dissatisfaction with said orthotics.  Patient was advised of breakin period and how to report any issues. 

## 2020-03-01 ENCOUNTER — Ambulatory Visit: Payer: Self-pay | Attending: Internal Medicine

## 2020-03-01 DIAGNOSIS — Z23 Encounter for immunization: Secondary | ICD-10-CM

## 2020-03-01 NOTE — Progress Notes (Signed)
   Covid-19 Vaccination Clinic  Name:  Crystal Maynard    MRN: [id number] DOB: [birth year]  03/01/2020  Crystal Maynard was observed post Covid-19 immunization for 15 minutes without incident. She was provided with Vaccine Information Sheet and instruction to access the V-Safe system.   Crystal Maynard was instructed to call 911 with any severe reactions post vaccine: Marland Kitchen Difficulty breathing  . Swelling of face and throat  . A fast heartbeat  . A bad rash all over body  . Dizziness and weakness   Immunizations Administered    Name Date Dose VIS Date Route   Pfizer COVID-19 Vaccine 03/01/2020 12:23 PM 0.3 mL 10/31/2019 Intramuscular   Manufacturer: [place]   Lot: [id number]   [place]: [id number]

## 2020-03-22 ENCOUNTER — Ambulatory Visit: Payer: Self-pay | Attending: Internal Medicine

## 2020-03-22 DIAGNOSIS — Z23 Encounter for immunization: Secondary | ICD-10-CM

## 2020-03-22 NOTE — Progress Notes (Signed)
   Covid-19 Vaccination Clinic  Name:  RIYANN RUSTIN    MRN: [id number] DOB: 04-09-1966  03/22/2020  Ms. Palmero was observed post Covid-19 immunization for 15 minutes without incident. She was provided with Vaccine Information Sheet and instruction to access the V-Safe system.   Ms. Pigman was instructed to call 911 with any severe reactions post vaccine: Marland Kitchen Difficulty breathing  . Swelling of face and throat  . A fast heartbeat  . A bad rash all over body  . Dizziness and weakness   Immunizations Administered    Name Date Dose VIS Date Route   Pfizer COVID-19 Vaccine 03/22/2020  1:58 PM 0.3 mL 01/14/2019 Intramuscular   Manufacturer: [place]   Lot: [id number]   [place]: [id number]

## 2020-06-15 DIAGNOSIS — Z6824 Body mass index (BMI) 24.0-24.9, adult: Secondary | ICD-10-CM | POA: Diagnosis not present

## 2020-06-15 DIAGNOSIS — Z1231 Encounter for screening mammogram for malignant neoplasm of breast: Secondary | ICD-10-CM | POA: Diagnosis not present

## 2020-06-15 DIAGNOSIS — Z01419 Encounter for gynecological examination (general) (routine) without abnormal findings: Secondary | ICD-10-CM | POA: Diagnosis not present

## 2020-06-16 ENCOUNTER — Other Ambulatory Visit: Payer: Self-pay | Admitting: Radiology

## 2020-06-16 DIAGNOSIS — Z803 Family history of malignant neoplasm of breast: Secondary | ICD-10-CM

## 2020-06-28 DIAGNOSIS — Z0000 Encounter for general adult medical examination without abnormal findings: Secondary | ICD-10-CM | POA: Diagnosis not present

## 2020-06-28 DIAGNOSIS — E7849 Other hyperlipidemia: Secondary | ICD-10-CM | POA: Diagnosis not present

## 2020-07-05 DIAGNOSIS — R82998 Other abnormal findings in urine: Secondary | ICD-10-CM | POA: Diagnosis not present

## 2020-07-05 DIAGNOSIS — I10 Essential (primary) hypertension: Secondary | ICD-10-CM | POA: Diagnosis not present

## 2020-07-05 DIAGNOSIS — Z1212 Encounter for screening for malignant neoplasm of rectum: Secondary | ICD-10-CM | POA: Diagnosis not present

## 2020-07-05 DIAGNOSIS — Z0000 Encounter for general adult medical examination without abnormal findings: Secondary | ICD-10-CM | POA: Diagnosis not present

## 2020-07-14 ENCOUNTER — Ambulatory Visit
Admission: RE | Admit: 2020-07-14 | Discharge: 2020-07-14 | Disposition: A | Discharge status: Home / Self Care | Payer: BC Managed Care – PPO | Source: Ambulatory Visit | Attending: Radiology | Admitting: Radiology

## 2020-07-14 DIAGNOSIS — N6012 Diffuse cystic mastopathy of left breast: Secondary | ICD-10-CM | POA: Diagnosis not present

## 2020-07-14 DIAGNOSIS — Z803 Family history of malignant neoplasm of breast: Secondary | ICD-10-CM | POA: Diagnosis not present

## 2020-07-14 DIAGNOSIS — N6323 Unspecified lump in the left breast, lower outer quadrant: Secondary | ICD-10-CM | POA: Diagnosis not present

## 2020-07-14 DIAGNOSIS — N6011 Diffuse cystic mastopathy of right breast: Secondary | ICD-10-CM | POA: Diagnosis not present

## 2020-07-14 MED ORDER — GADOBUTROL 1 MMOL/ML IV SOLN
6.0000 mL | Freq: Once | INTRAVENOUS | Status: AC | PRN
  Administered 2020-07-14: 6 mL via INTRAVENOUS

## 2020-07-15 ENCOUNTER — Other Ambulatory Visit: Payer: Self-pay | Admitting: Radiology

## 2020-07-15 DIAGNOSIS — R9389 Abnormal findings on diagnostic imaging of other specified body structures: Secondary | ICD-10-CM

## 2020-07-21 HISTORY — PX: BREAST BIOPSY: SHX20

## 2020-07-22 ENCOUNTER — Ambulatory Visit
Admission: RE | Admit: 2020-07-22 | Discharge: 2020-07-22 | Disposition: A | Discharge status: Home / Self Care | Payer: BC Managed Care – PPO | Source: Ambulatory Visit | Attending: Radiology | Admitting: Radiology

## 2020-07-22 ENCOUNTER — Other Ambulatory Visit: Payer: Self-pay | Admitting: Radiology

## 2020-07-22 ENCOUNTER — Other Ambulatory Visit: Payer: Self-pay

## 2020-07-22 DIAGNOSIS — R9389 Abnormal findings on diagnostic imaging of other specified body structures: Secondary | ICD-10-CM

## 2020-07-22 DIAGNOSIS — N6012 Diffuse cystic mastopathy of left breast: Secondary | ICD-10-CM | POA: Diagnosis not present

## 2020-08-06 ENCOUNTER — Ambulatory Visit
Admission: RE | Admit: 2020-08-06 | Discharge: 2020-08-06 | Disposition: A | Discharge status: Home / Self Care | Payer: BC Managed Care – PPO | Source: Ambulatory Visit | Attending: Radiology | Admitting: Radiology

## 2020-08-06 ENCOUNTER — Other Ambulatory Visit: Payer: Self-pay | Admitting: Diagnostic Radiology

## 2020-08-06 ENCOUNTER — Other Ambulatory Visit: Payer: Self-pay

## 2020-08-06 ENCOUNTER — Other Ambulatory Visit (HOSPITAL_COMMUNITY): Payer: Self-pay | Admitting: Diagnostic Radiology

## 2020-08-06 DIAGNOSIS — R9389 Abnormal findings on diagnostic imaging of other specified body structures: Secondary | ICD-10-CM

## 2020-08-06 DIAGNOSIS — N6012 Diffuse cystic mastopathy of left breast: Secondary | ICD-10-CM | POA: Diagnosis not present

## 2020-08-06 DIAGNOSIS — N6323 Unspecified lump in the left breast, lower outer quadrant: Secondary | ICD-10-CM | POA: Diagnosis not present

## 2020-08-06 MED ORDER — GADOBUTROL 1 MMOL/ML IV SOLN
7.0000 mL | Freq: Once | INTRAVENOUS | Status: AC | PRN
  Administered 2020-08-06: 7 mL via INTRAVENOUS

## 2020-08-27 ENCOUNTER — Other Ambulatory Visit: Payer: Self-pay | Admitting: Surgery

## 2020-08-27 DIAGNOSIS — N6489 Other specified disorders of breast: Secondary | ICD-10-CM | POA: Diagnosis not present

## 2020-08-28 DIAGNOSIS — Z23 Encounter for immunization: Secondary | ICD-10-CM | POA: Diagnosis not present

## 2020-08-30 ENCOUNTER — Other Ambulatory Visit: Payer: Self-pay | Admitting: Surgery

## 2020-08-30 DIAGNOSIS — N6489 Other specified disorders of breast: Secondary | ICD-10-CM

## 2020-09-27 HISTORY — PX: BREAST EXCISIONAL BIOPSY: SUR124

## 2020-09-30 ENCOUNTER — Other Ambulatory Visit: Payer: Self-pay

## 2020-09-30 ENCOUNTER — Encounter (HOSPITAL_BASED_OUTPATIENT_CLINIC_OR_DEPARTMENT_OTHER): Payer: Self-pay | Admitting: Surgery

## 2020-10-04 ENCOUNTER — Other Ambulatory Visit (HOSPITAL_COMMUNITY): Payer: BC Managed Care – PPO

## 2020-10-04 ENCOUNTER — Other Ambulatory Visit (HOSPITAL_COMMUNITY)
Admission: RE | Admit: 2020-10-04 | Discharge: 2020-10-04 | Disposition: A | Discharge status: Home / Self Care | Payer: BC Managed Care – PPO | Source: Ambulatory Visit | Attending: Surgery | Admitting: Surgery

## 2020-10-04 ENCOUNTER — Encounter (HOSPITAL_BASED_OUTPATIENT_CLINIC_OR_DEPARTMENT_OTHER)
Admission: RE | Admit: 2020-10-04 | Discharge: 2020-10-04 | Disposition: A | Discharge status: Home / Self Care | Payer: BC Managed Care – PPO | Source: Ambulatory Visit | Attending: Surgery | Admitting: Surgery

## 2020-10-04 DIAGNOSIS — Z7951 Long term (current) use of inhaled steroids: Secondary | ICD-10-CM | POA: Diagnosis not present

## 2020-10-04 DIAGNOSIS — Z20822 Contact with and (suspected) exposure to covid-19: Secondary | ICD-10-CM | POA: Diagnosis not present

## 2020-10-04 DIAGNOSIS — Z803 Family history of malignant neoplasm of breast: Secondary | ICD-10-CM | POA: Diagnosis not present

## 2020-10-04 DIAGNOSIS — Z01812 Encounter for preprocedural laboratory examination: Secondary | ICD-10-CM | POA: Insufficient documentation

## 2020-10-04 DIAGNOSIS — Z01810 Encounter for preprocedural cardiovascular examination: Secondary | ICD-10-CM | POA: Insufficient documentation

## 2020-10-04 DIAGNOSIS — N6489 Other specified disorders of breast: Secondary | ICD-10-CM | POA: Diagnosis not present

## 2020-10-04 DIAGNOSIS — Z880 Allergy status to penicillin: Secondary | ICD-10-CM | POA: Diagnosis not present

## 2020-10-04 DIAGNOSIS — Z79899 Other long term (current) drug therapy: Secondary | ICD-10-CM | POA: Diagnosis not present

## 2020-10-04 LAB — SARS CORONAVIRUS 2 (TAT 6-24 HRS): SARS Coronavirus 2: NEGATIVE

## 2020-10-04 MED ORDER — ENSURE PRE-SURGERY PO LIQD: 296.0000 mL | Freq: Once | ORAL | Status: DC

## 2020-10-04 NOTE — Progress Notes (Signed)

## 2020-10-06 ENCOUNTER — Ambulatory Visit
Admission: RE | Admit: 2020-10-06 | Discharge: 2020-10-06 | Disposition: A | Discharge status: Home / Self Care | Payer: BC Managed Care – PPO | Source: Ambulatory Visit | Attending: Surgery | Admitting: Surgery

## 2020-10-06 ENCOUNTER — Other Ambulatory Visit: Payer: Self-pay

## 2020-10-06 DIAGNOSIS — N6489 Other specified disorders of breast: Secondary | ICD-10-CM

## 2020-10-06 DIAGNOSIS — R928 Other abnormal and inconclusive findings on diagnostic imaging of breast: Secondary | ICD-10-CM | POA: Diagnosis not present

## 2020-10-06 NOTE — H&P (Signed)
Crystal Maynard Location: [facility] Patient #: [id number] DOB: 1966/01/28 Married / Language: English / Race: White Female   History of Present Illness   The patient is a 54 year old female who presents with a complaint of Breast problems.  Chief complaint: Complex sclerosing lesion of left breast  This is a 54 year old female with a significant family history of breast cancer. Her mother, 2 maternal aunts, and a cousin have all had breast cancer. Her calculated lifetime risk of developing breast cancer is 28.2%. She has had frequent MRIs and biopsies. Her most recent MRI in August of this year showed a suspicious area measuring 7 mm in the left breast. This was approximately 3 o'clock position. She underwent a biopsy of this showing a complex sclerosing lesion. Surgical excision of the series been recommended especially given her family history. She is otherwise without complaints. She reports some skin dimpling at the biopsy site. She denies nipple discharge.   Past Surgical History Breast Biopsy  Bilateral.  Diagnostic Studies History  Colonoscopy  1-5 years ago Mammogram  within last year Pap Smear  1-5 years ago  Allergies Augmentin *PENICILLINS*  Allergies Reconciled   Medication History  Olmesartan Medoxomil (20MG  Tablet, Oral) Active. Asmanex (60 Metered Doses) (220MCG/INH Aero Pow Br Act, Inhalation) Active. ZyrTEC (Oral) Specific strength unknown - Active. Medications Reconciled  Social History Caffeine use  Carbonated beverages, Coffee. No alcohol use  No drug use  Tobacco use  Never smoker.  Family History Arthritis  Mother. Breast Cancer  Mother. Depression  Mother. Diabetes Mellitus  Father. Hypertension  Father, Mother. Melanoma  Father.  Pregnancy / Birth History  Age at menarche  14 years. Contraceptive History  Contraceptive implant. Gravida  5 Irregular periods  Length (months) of breastfeeding   >24 Maternal age  3-30 Para  4  Other Problems  Asthma  High blood pressure  Kidney Stone     Review of Systems General Not Present- Appetite Loss, Chills, Fatigue, Fever, Night Sweats, Weight Gain and Weight Loss. Skin Present- Non-Healing Wounds. Not Present- Change in Wart/Mole, Dryness, Hives, Jaundice, New Lesions, Rash and Ulcer. HEENT Not Present- Earache, Hearing Loss, Hoarseness, Nose Bleed, Oral Ulcers, Ringing in the Ears, Seasonal Allergies, Sinus Pain, Sore Throat, Visual Disturbances, Wears glasses/contact lenses and Yellow Eyes. Respiratory Not Present- Bloody sputum, Chronic Cough, Difficulty Breathing, Snoring and Wheezing. Breast Present- Breast Pain. Not Present- Breast Mass, Nipple Discharge and Skin Changes. Cardiovascular Not Present- Chest Pain, Difficulty Breathing Lying Down, Leg Cramps, Palpitations, Rapid Heart Rate, Shortness of Breath and Swelling of Extremities. Gastrointestinal Not Present- Abdominal Pain, Bloating, Bloody Stool, Change in Bowel Habits, Chronic diarrhea, Constipation, Difficulty Swallowing, Excessive gas, Gets full quickly at meals, Hemorrhoids, Indigestion, Nausea, Rectal Pain and Vomiting. Female Genitourinary Not Present- Frequency, Nocturia, Painful Urination, Pelvic Pain and Urgency. Musculoskeletal Not Present- Back Pain, Joint Pain, Joint Stiffness, Muscle Pain, Muscle Weakness and Swelling of Extremities. Neurological Not Present- Decreased Memory, Fainting, Headaches, Numbness, Seizures, Tingling, Tremor, Trouble walking and Weakness. Psychiatric Not Present- Anxiety, Bipolar, Change in Sleep Pattern, Depression, Fearful and Frequent crying. Endocrine Not Present- Cold Intolerance, Excessive Hunger, Hair Changes, Heat Intolerance, Hot flashes and New Diabetes. Hematology Not Present- Blood Thinners, Easy Bruising, Excessive bleeding, Gland problems, HIV and Persistent Infections.  Vitals  Weight: 145.5 lb Height:  64in Body Surface Area: 1.71 m Body Mass Index: 24.97 kg/m  Temp.: 97.60F  Pulse: 87 (Regular)  BP: 128/80(Sitting, Left Arm, Standard)     Physical  Exam  The physical exam findings are as follows: Note: She appears well in exam.  The left breast is soft. There is a small hematoma near the biopsy site in the left breast laterally. There is skin dimpling present from the biopsy. There is a small palpable hematoma. The nipple areolar complex is normal.    Assessment & Plan   COMPLEX SCLEROSING LESION OF LEFT BREAST (N64.89)  Impression: I reviewed her mammogram, MRI, and pathology results. She has a complex sclerosing lesion of left breast and a very strong family history of breast cancer. A radioactive seed cut left breast lumpectomy strongly recommended and she is eager to proceed with surgery given her history. We discussed the surgical procedure in detail. I discussed the risks which includes but is not limited to bleeding, infection, injury to surrounding structures, need for further surgery for malignancy is found, cardiopulmonary issues, postoperative recovery, etc. She understands and wishes to proceed with surgery which will be scheduled.

## 2020-10-07 ENCOUNTER — Ambulatory Visit (HOSPITAL_BASED_OUTPATIENT_CLINIC_OR_DEPARTMENT_OTHER): Payer: BC Managed Care – PPO | Admitting: Anesthesiology

## 2020-10-07 ENCOUNTER — Other Ambulatory Visit: Payer: Self-pay

## 2020-10-07 ENCOUNTER — Encounter (HOSPITAL_BASED_OUTPATIENT_CLINIC_OR_DEPARTMENT_OTHER): Payer: Self-pay | Admitting: Surgery

## 2020-10-07 ENCOUNTER — Ambulatory Visit
Admission: RE | Admit: 2020-10-07 | Discharge: 2020-10-07 | Disposition: A | Discharge status: Home / Self Care | Payer: BC Managed Care – PPO | Source: Ambulatory Visit | Attending: Surgery | Admitting: Surgery

## 2020-10-07 ENCOUNTER — Ambulatory Visit (HOSPITAL_BASED_OUTPATIENT_CLINIC_OR_DEPARTMENT_OTHER)
Admission: RE | Admit: 2020-10-07 | Discharge: 2020-10-07 | Disposition: A | Discharge status: Home / Self Care | Payer: BC Managed Care – PPO | Attending: Surgery | Admitting: Surgery

## 2020-10-07 ENCOUNTER — Encounter (HOSPITAL_BASED_OUTPATIENT_CLINIC_OR_DEPARTMENT_OTHER)
Admission: RE | Disposition: A | Discharge status: Home / Self Care | Payer: Self-pay | Source: Home / Self Care | Attending: Surgery

## 2020-10-07 DIAGNOSIS — Z803 Family history of malignant neoplasm of breast: Secondary | ICD-10-CM | POA: Diagnosis not present

## 2020-10-07 DIAGNOSIS — Z880 Allergy status to penicillin: Secondary | ICD-10-CM | POA: Insufficient documentation

## 2020-10-07 DIAGNOSIS — N6489 Other specified disorders of breast: Secondary | ICD-10-CM

## 2020-10-07 DIAGNOSIS — I10 Essential (primary) hypertension: Secondary | ICD-10-CM | POA: Diagnosis not present

## 2020-10-07 DIAGNOSIS — J45909 Unspecified asthma, uncomplicated: Secondary | ICD-10-CM | POA: Diagnosis not present

## 2020-10-07 DIAGNOSIS — Z7951 Long term (current) use of inhaled steroids: Secondary | ICD-10-CM | POA: Insufficient documentation

## 2020-10-07 DIAGNOSIS — N6012 Diffuse cystic mastopathy of left breast: Secondary | ICD-10-CM | POA: Diagnosis not present

## 2020-10-07 DIAGNOSIS — Z20822 Contact with and (suspected) exposure to covid-19: Secondary | ICD-10-CM | POA: Insufficient documentation

## 2020-10-07 DIAGNOSIS — N6082 Other benign mammary dysplasias of left breast: Secondary | ICD-10-CM | POA: Diagnosis not present

## 2020-10-07 DIAGNOSIS — N6092 Unspecified benign mammary dysplasia of left breast: Secondary | ICD-10-CM | POA: Diagnosis not present

## 2020-10-07 DIAGNOSIS — R920 Mammographic microcalcification found on diagnostic imaging of breast: Secondary | ICD-10-CM | POA: Diagnosis not present

## 2020-10-07 DIAGNOSIS — Z79899 Other long term (current) drug therapy: Secondary | ICD-10-CM | POA: Diagnosis not present

## 2020-10-07 DIAGNOSIS — R928 Other abnormal and inconclusive findings on diagnostic imaging of breast: Secondary | ICD-10-CM | POA: Diagnosis not present

## 2020-10-07 HISTORY — PX: BREAST LUMPECTOMY WITH RADIOACTIVE SEED LOCALIZATION: SHX6424

## 2020-10-07 LAB — POCT PREGNANCY, URINE: Preg Test, Ur: NEGATIVE

## 2020-10-07 SURGERY — BREAST LUMPECTOMY WITH RADIOACTIVE SEED LOCALIZATION
Anesthesia: General | Site: Breast | Laterality: Left

## 2020-10-07 MED ORDER — GABAPENTIN 300 MG PO CAPS
ORAL_CAPSULE | ORAL | Status: AC
  Filled 2020-10-07: qty 1

## 2020-10-07 MED ORDER — LIDOCAINE HCL (CARDIAC) PF 100 MG/5ML IV SOSY
PREFILLED_SYRINGE | INTRAVENOUS | Status: DC | PRN
  Administered 2020-10-07: 60 mg via INTRATRACHEAL

## 2020-10-07 MED ORDER — HYDROMORPHONE HCL 1 MG/ML IJ SOLN: 0.2500 mg | INTRAMUSCULAR | Status: DC | PRN

## 2020-10-07 MED ORDER — PROMETHAZINE HCL 25 MG/ML IJ SOLN: 6.2500 mg | INTRAMUSCULAR | Status: DC | PRN

## 2020-10-07 MED ORDER — CHLORHEXIDINE GLUCONATE CLOTH 2 % EX PADS: 6.0000 | MEDICATED_PAD | Freq: Once | CUTANEOUS | Status: DC

## 2020-10-07 MED ORDER — CIPROFLOXACIN IN D5W 400 MG/200ML IV SOLN
400.0000 mg | INTRAVENOUS | Status: AC
  Administered 2020-10-07: 400 mg via INTRAVENOUS

## 2020-10-07 MED ORDER — MIDAZOLAM HCL 5 MG/5ML IJ SOLN
INTRAMUSCULAR | Status: DC | PRN
  Administered 2020-10-07: 2 mg via INTRAVENOUS

## 2020-10-07 MED ORDER — CELECOXIB 200 MG PO CAPS
400.0000 mg | ORAL_CAPSULE | ORAL | Status: AC
  Administered 2020-10-07: 400 mg via ORAL

## 2020-10-07 MED ORDER — OXYCODONE HCL 5 MG/5ML PO SOLN: 5.0000 mg | Freq: Once | ORAL | Status: DC | PRN

## 2020-10-07 MED ORDER — DEXAMETHASONE SODIUM PHOSPHATE 10 MG/ML IJ SOLN
INTRAMUSCULAR | Status: DC | PRN
  Administered 2020-10-07: 10 mg via INTRAVENOUS

## 2020-10-07 MED ORDER — ONDANSETRON HCL 4 MG/2ML IJ SOLN
INTRAMUSCULAR | Status: AC
  Filled 2020-10-07: qty 2

## 2020-10-07 MED ORDER — CELECOXIB 200 MG PO CAPS
ORAL_CAPSULE | ORAL | Status: AC
  Filled 2020-10-07: qty 2

## 2020-10-07 MED ORDER — FENTANYL CITRATE (PF) 100 MCG/2ML IJ SOLN
INTRAMUSCULAR | Status: DC | PRN
  Administered 2020-10-07 (×2): 25 ug via INTRAVENOUS
  Administered 2020-10-07: 50 ug via INTRAVENOUS

## 2020-10-07 MED ORDER — LIDOCAINE 2% (20 MG/ML) 5 ML SYRINGE
INTRAMUSCULAR | Status: AC
  Filled 2020-10-07: qty 5

## 2020-10-07 MED ORDER — MIDAZOLAM HCL 2 MG/2ML IJ SOLN
INTRAMUSCULAR | Status: AC
  Filled 2020-10-07: qty 2

## 2020-10-07 MED ORDER — PROPOFOL 10 MG/ML IV BOLUS
INTRAVENOUS | Status: DC | PRN
  Administered 2020-10-07: 150 mg via INTRAVENOUS

## 2020-10-07 MED ORDER — OXYCODONE HCL 5 MG PO TABS: 5.0000 mg | ORAL_TABLET | Freq: Once | ORAL | Status: DC | PRN

## 2020-10-07 MED ORDER — ACETAMINOPHEN 500 MG PO TABS
ORAL_TABLET | ORAL | Status: AC
  Filled 2020-10-07: qty 2

## 2020-10-07 MED ORDER — LACTATED RINGERS IV SOLN: INTRAVENOUS | Status: DC

## 2020-10-07 MED ORDER — DEXAMETHASONE SODIUM PHOSPHATE 10 MG/ML IJ SOLN
INTRAMUSCULAR | Status: AC
  Filled 2020-10-07: qty 1

## 2020-10-07 MED ORDER — BUPIVACAINE HCL 0.25 % IJ SOLN
INTRAMUSCULAR | Status: DC | PRN
  Administered 2020-10-07: 14 mL

## 2020-10-07 MED ORDER — GABAPENTIN 300 MG PO CAPS
300.0000 mg | ORAL_CAPSULE | ORAL | Status: AC
  Administered 2020-10-07: 300 mg via ORAL

## 2020-10-07 MED ORDER — MEPERIDINE HCL 25 MG/ML IJ SOLN: 6.2500 mg | INTRAMUSCULAR | Status: DC | PRN

## 2020-10-07 MED ORDER — FENTANYL CITRATE (PF) 100 MCG/2ML IJ SOLN
INTRAMUSCULAR | Status: AC
  Filled 2020-10-07: qty 2

## 2020-10-07 MED ORDER — CIPROFLOXACIN IN D5W 400 MG/200ML IV SOLN
INTRAVENOUS | Status: AC
  Filled 2020-10-07: qty 200

## 2020-10-07 MED ORDER — ACETAMINOPHEN 500 MG PO TABS
1000.0000 mg | ORAL_TABLET | ORAL | Status: AC
  Administered 2020-10-07: 1000 mg via ORAL

## 2020-10-07 MED ORDER — OXYCODONE HCL 5 MG PO TABS
5.0000 mg | ORAL_TABLET | Freq: Four times a day (QID) | ORAL | 0 refills | Status: AC | PRN
Start: 2020-10-07 — End: ?

## 2020-10-07 MED ORDER — AMISULPRIDE (ANTIEMETIC) 5 MG/2ML IV SOLN: 10.0000 mg | Freq: Once | INTRAVENOUS | Status: DC | PRN

## 2020-10-07 SURGICAL SUPPLY — 49 items
ADH SKN CLS APL DERMABOND .7 (GAUZE/BANDAGES/DRESSINGS) ×1
APL PRP STRL LF DISP 70% ISPRP (MISCELLANEOUS) ×1
APPLIER CLIP 9.375 MED OPEN (MISCELLANEOUS)
APR CLP MED 9.3 20 MLT OPN (MISCELLANEOUS)
BINDER BREAST 3XL (GAUZE/BANDAGES/DRESSINGS) IMPLANT
BINDER BREAST LRG (GAUZE/BANDAGES/DRESSINGS) IMPLANT
BINDER BREAST MEDIUM (GAUZE/BANDAGES/DRESSINGS) IMPLANT
BINDER BREAST XLRG (GAUZE/BANDAGES/DRESSINGS) IMPLANT
BINDER BREAST XXLRG (GAUZE/BANDAGES/DRESSINGS) IMPLANT
BLADE SURG 15 STRL LF DISP TIS (BLADE) ×1 IMPLANT
BLADE SURG 15 STRL SS (BLADE) ×2
CANISTER SUC SOCK COL 7IN (MISCELLANEOUS) IMPLANT
CANISTER SUCT 1200ML W/VALVE (MISCELLANEOUS) ×1 IMPLANT
CHLORAPREP W/TINT 26 (MISCELLANEOUS) ×2 IMPLANT
CLIP APPLIE 9.375 MED OPEN (MISCELLANEOUS) IMPLANT
COVER BACK TABLE 60X90IN (DRAPES) ×2 IMPLANT
COVER MAYO STAND STRL (DRAPES) ×2 IMPLANT
COVER PROBE W GEL 5X96 (DRAPES) ×2 IMPLANT
COVER WAND RF STERILE (DRAPES) IMPLANT
DECANTER SPIKE VIAL GLASS SM (MISCELLANEOUS) ×1 IMPLANT
DERMABOND ADVANCED (GAUZE/BANDAGES/DRESSINGS) ×1
DERMABOND ADVANCED .7 DNX12 (GAUZE/BANDAGES/DRESSINGS) ×1 IMPLANT
DRAPE LAPAROSCOPIC ABDOMINAL (DRAPES) ×2 IMPLANT
DRAPE UTILITY XL STRL (DRAPES) ×2 IMPLANT
ELECT REM PT RETURN 9FT ADLT (ELECTROSURGICAL) ×2
ELECTRODE REM PT RTRN 9FT ADLT (ELECTROSURGICAL) ×1 IMPLANT
GAUZE SPONGE 4X4 12PLY STRL LF (GAUZE/BANDAGES/DRESSINGS) IMPLANT
GLOVE SURG SIGNA 7.5 PF LTX (GLOVE) ×2 IMPLANT
GOWN STRL REUS W/ TWL LRG LVL3 (GOWN DISPOSABLE) ×1 IMPLANT
GOWN STRL REUS W/ TWL XL LVL3 (GOWN DISPOSABLE) ×1 IMPLANT
GOWN STRL REUS W/TWL LRG LVL3 (GOWN DISPOSABLE) ×2
GOWN STRL REUS W/TWL XL LVL3 (GOWN DISPOSABLE) ×2
KIT MARKER MARGIN INK (KITS) ×2 IMPLANT
NDL HYPO 25X1 1.5 SAFETY (NEEDLE) ×1 IMPLANT
NEEDLE HYPO 25X1 1.5 SAFETY (NEEDLE) ×2 IMPLANT
NS IRRIG 1000ML POUR BTL (IV SOLUTION) ×1 IMPLANT
PACK BASIN DAY SURGERY FS (CUSTOM PROCEDURE TRAY) ×2 IMPLANT
PENCIL SMOKE EVACUATOR (MISCELLANEOUS) ×2 IMPLANT
SLEEVE SCD COMPRESS KNEE MED (MISCELLANEOUS) ×2 IMPLANT
SPONGE LAP 4X18 RFD (DISPOSABLE) ×2 IMPLANT
SUT MNCRL AB 4-0 PS2 18 (SUTURE) ×2 IMPLANT
SUT SILK 2 0 SH (SUTURE) IMPLANT
SUT VIC AB 3-0 SH 27 (SUTURE) ×2
SUT VIC AB 3-0 SH 27X BRD (SUTURE) ×1 IMPLANT
SYR CONTROL 10ML LL (SYRINGE) ×2 IMPLANT
TOWEL GREEN STERILE FF (TOWEL DISPOSABLE) ×2 IMPLANT
TRAY FAXITRON CT DISP (TRAY / TRAY PROCEDURE) ×2 IMPLANT
TUBE CONNECTING 20X1/4 (TUBING) IMPLANT
YANKAUER SUCT BULB TIP NO VENT (SUCTIONS) IMPLANT

## 2020-10-07 NOTE — Transfer of Care (Signed)
Immediate Anesthesia Transfer of Care Note  Patient: Crystal Maynard  Procedure(s) Performed: LEFT BREAST LUMPECTOMY WITH RADIOACTIVE SEED LOCALIZATION (Left Breast)  Patient Location: PACU  Anesthesia Type:General  Level of Consciousness: drowsy and patient cooperative  Airway & Oxygen Therapy: Patient Spontanous Breathing and Patient connected to nasal cannula oxygen  Post-op Assessment: Report given to RN and Post -op Vital signs reviewed and stable  Post vital signs: Reviewed and stable  Last Vitals:  Vitals Value Taken Time  BP    Temp    Pulse    Resp    SpO2      Last Pain:  Vitals:   10/07/20 0946  TempSrc: Oral  PainSc: 2       Patients Stated Pain Goal: 4 (30/85/69 4370)  Complications: No complications documented.

## 2020-10-07 NOTE — Anesthesia Postprocedure Evaluation (Signed)
Anesthesia Post Note  Patient: Crystal Maynard  Procedure(s) Performed: LEFT BREAST LUMPECTOMY WITH RADIOACTIVE SEED LOCALIZATION (Left Breast)     Patient location during evaluation: PACU Anesthesia Type: General Level of consciousness: awake and alert Pain management: pain level controlled Vital Signs Assessment: post-procedure vital signs reviewed and stable Respiratory status: spontaneous breathing, nonlabored ventilation and respiratory function stable Cardiovascular status: blood pressure returned to baseline and stable Postop Assessment: no apparent nausea or vomiting Anesthetic complications: no   No complications documented.  Last Vitals:  Vitals:   10/07/20 1215 10/07/20 1230  BP: (!) 152/83 (!) 148/62  Pulse: 70 65  Resp: 16 16  Temp: 36.4 C 36.4 C  SpO2: 100% 100%    Last Pain:  Vitals:   10/07/20 1245  TempSrc:   PainSc: 0-No pain                 Lynda Rainwater

## 2020-10-07 NOTE — Interval H&P Note (Signed)
History and Physical Interval Note: no change in H and P  10/07/2020 10:29 AM  Crystal Maynard  has presented today for surgery, with the diagnosis of COMPLEX SCLEROSING LESION LEFT BREAST.  The various methods of treatment have been discussed with the patient and family. After consideration of risks, benefits and other options for treatment, the patient has consented to  Procedure(s) with comments: LEFT BREAST LUMPECTOMY WITH RADIOACTIVE SEED LOCALIZATION (Left) - LMA as a surgical intervention.  The patient's history has been reviewed, patient examined, no change in status, stable for surgery.  I have reviewed the patient's chart and labs.  Questions were answered to the patient's satisfaction.     Coralie Keens

## 2020-10-07 NOTE — Op Note (Signed)
LEFT BREAST LUMPECTOMY WITH RADIOACTIVE SEED LOCALIZATION  Procedure Note  LATRISE BOWLAND 10/07/2020   Pre-op Diagnosis: COMPLEX SCLEROSING LESION LEFT BREAST     Post-op Diagnosis: same  Procedure(s): LEFT BREAST LUMPECTOMY WITH RADIOACTIVE SEED LOCALIZATION  Surgeon(s): Coralie Keens, MD  Anesthesia: General  Staff:  Circulator: Maurene Capes, RN; Donnita Falls, RN Scrub Person: Courtney Heys S  Estimated Blood Loss: Minimal               Specimens: sent to path  Indications: This is a 54 year old female found to have a suspicious area in the left breast on screening mammography.  A biopsy was performed showing a complex grossing lesion.  Given her significant lifetime risk of developing breast cancer, the decision was made to remove this area in the left breast with a radioactive seed guided lumpectomy  Procedure: The patient was brought to the operating room and identifies correct patient.  Placed on the operating table general anesthesia was induced.  Her left breast was prepped and draped in usual sterile fashion.  I anesthetized skin around the edge of the areola with Marcaine and then made a lateral incision with a scalpel.  I then dissected laterally down to the breast tissue with electrocautery moving to the area of the radioactive seed.  The seed was approximate the 3 to 4 o'clock position of the breast deep in the breast tissue.  With the aid of the neoprobe I dissected to the deep breast tissue and performed a lumpectomy staying around the radioactive seed with the cautery.  I then completed the lumpectomy removing the specimen.  I marked the margins with pain.  An x-ray was performed confirming that the radioactive seed and previous tissue marker were in the specimen.  The specimen was then sent to pathology for evaluation.  I again evaluated the breast cavity and achieved hemostasis with the cautery and surgical clips.  I anesthetized the incision further with  Marcaine.  I then freed up the skin at an old scar from the original biopsy with a hemostat.  I then closed the subcutaneous tissue with interrupted 3-0 Vicryl sutures and closed the skin with a running 4-0 Monocryl.  Dermabond was then applied.  The patient tolerated the procedure well.  All the counts were correct at the end of the procedure.  The patient was then extubated in the operating room and taken in stable condition to the recovery room.          Coralie Keens   Date: 10/07/2020  Time: 11:36 AM

## 2020-10-07 NOTE — Anesthesia Procedure Notes (Signed)
Procedure Name: LMA Insertion Date/Time: 10/07/2020 10:52 AM Performed by: Glory Buff, CRNA Pre-anesthesia Checklist: Patient identified, Emergency Drugs available, Suction available and Patient being monitored Patient Re-evaluated:Patient Re-evaluated prior to induction Oxygen Delivery Method: Circle system utilized Preoxygenation: Pre-oxygenation with 100% oxygen Induction Type: IV induction LMA: LMA inserted LMA Size: 4.0 Number of attempts: 1 Placement Confirmation: positive ETCO2 Tube secured with: Tape Dental Injury: Teeth and Oropharynx as per pre-operative assessment

## 2020-10-07 NOTE — Discharge Instructions (Signed)
Post Anesthesia Home Care Instructions  Activity: Get plenty of rest for the remainder of the day. A responsible individual must stay with you for 24 hours following the procedure.  For the next 24 hours, DO NOT: -Drive a car -Paediatric nurse -Drink alcoholic beverages -Take any medication unless instructed by your physician -Make any legal decisions or sign important papers.  Meals: Start with liquid foods such as gelatin or soup. Progress to regular foods as tolerated. Avoid greasy, spicy, heavy foods. If nausea and/or vomiting occur, drink only clear liquids until the nausea and/or vomiting subsides. Call your physician if vomiting continues.  Special Instructions/Symptoms: Your throat may feel dry or sore from the anesthesia or the breathing tube placed in your throat during surgery. If this causes discomfort, gargle with warm salt water. The discomfort should disappear within 24 hours.  If you had a scopolamine patch placed behind your ear for the management of post- operative nausea and/or vomiting:  1. The medication in the patch is effective for 72 hours, after which it should be removed.  Wrap patch in a tissue and discard in the trash. Wash hands thoroughly with soap and water. 2. You may remove the patch earlier than 72 hours if you experience unpleasant side effects which may include dry mouth, dizziness or visual disturbances. 3. Avoid touching the patch. Wash your hands with soap and water after contact with the patch.         [facility] Office Phone Number [phone]  BREAST BIOPSY/ PARTIAL MASTECTOMY: POST OP INSTRUCTIONS  Always review your discharge instruction sheet given to you by the facility where your surgery was performed.  IF YOU HAVE DISABILITY OR FAMILY LEAVE FORMS, YOU MUST BRING THEM TO THE OFFICE FOR PROCESSING.  DO NOT GIVE THEM TO YOUR DOCTOR.  1. A prescription for pain medication may be given to you upon discharge.  Take  your pain medication as prescribed, if needed.  If narcotic pain medicine is not needed, then you may take acetaminophen (Tylenol) or ibuprofen (Advil) as needed. 2. Take your usually prescribed medications unless otherwise directed 3. If you need a refill on your pain medication, please contact your pharmacy.  They will contact our office to request authorization.  Prescriptions will not be filled after 5pm or on week-ends. 4. You should eat very light the first 24 hours after surgery, such as soup, crackers, pudding, etc.  Resume your normal diet the day after surgery. 5. Most patients will experience some swelling and bruising in the breast.  Ice packs and a good support bra will help.  Swelling and bruising can take several days to resolve.  6. It is common to experience some constipation if taking pain medication after surgery.  Increasing fluid intake and taking a stool softener will usually help or prevent this problem from occurring.  A mild laxative (Milk of Magnesia or Miralax) should be taken according to package directions if there are no bowel movements after 48 hours. 7. Unless discharge instructions indicate otherwise, you may remove your bandages 24-48 hours after surgery, and you may shower at that time.  You may have steri-strips (small skin tapes) in place directly over the incision.  These strips should be left on the skin for 7-10 days.  If your surgeon used skin glue on the incision, you may shower in 24 hours.  The glue will flake off over the next 2-3 weeks.  Any sutures or staples will be removed at the office during your  follow-up visit. 8. ACTIVITIES:  You may resume regular daily activities (gradually increasing) beginning the next day.  Wearing a good support bra or sports bra minimizes pain and swelling.  You may have sexual intercourse when it is comfortable. a. You may drive when you no longer are taking prescription pain medication, you can comfortably wear a seatbelt, and  you can safely maneuver your car and apply brakes. b. RETURN TO WORK:  ______________________________________________________________________________________ 9. You should see your doctor in the office for a follow-up appointment approximately two weeks after your surgery.  Your doctor's nurse will typically make your follow-up appointment when she calls you with your pathology report.  Expect your pathology report 2-3 business days after your surgery.  You may call to check if you do not hear from [place] after three days. 10. OTHER INSTRUCTIONS:OK TO REMOVE THE BINDER AND SHOWER STARTING TOMORROW 11. ICE PACK, TYLENOL, AND IBUPROFEN ALSO FOR PAIN 12. NO VIGOROUS ACTIVITY FOR ONE WEEK _______________________________________________________________________________________________ _____________________________________________________________________________________________________________________________________ _____________________________________________________________________________________________________________________________________ _____________________________________________________________________________________________________________________________________  WHEN TO CALL YOUR DOCTOR: 1. Fever over 101.0 2. Nausea and/or vomiting. 3. Extreme swelling or bruising. 4. Continued bleeding from incision. 5. Increased pain, redness, or drainage from the incision.  The clinic staff is available to answer your questions during regular business hours.  Please don't hesitate to call and ask to speak to one of the nurses for clinical concerns.  If you have a medical emergency, go to the nearest emergency room or call 911.  A surgeon from [facility] is always on call at the hospital.  For further questions, please visit [email or web address]

## 2020-10-07 NOTE — Anesthesia Preprocedure Evaluation (Signed)
Anesthesia Evaluation  Patient identified by MRN, date of birth, ID band Patient awake    Airway Mallampati: I  TM Distance: >3 FB Neck ROM: full    Dental no notable dental hx.    Pulmonary asthma ,    Pulmonary exam normal breath sounds clear to auscultation       Cardiovascular hypertension, Pt. on medications Normal cardiovascular exam Rhythm:regular Rate:Normal     Neuro/Psych    GI/Hepatic   Endo/Other    Renal/GU      Musculoskeletal   Abdominal   Peds  Hematology   Anesthesia Other Findings   Reproductive/Obstetrics                             Anesthesia Physical  Anesthesia Plan  ASA: II  Anesthesia Plan: General   Post-op Pain Management:    Induction: Intravenous  PONV Risk Score and Plan: 3 and Ondansetron, Dexamethasone, Midazolam and Treatment may vary due to age or medical condition  Airway Management Planned: LMA  Additional Equipment:   Intra-op Plan:   Post-operative Plan: Extubation in OR  Informed Consent: I have reviewed the patients History and Physical, chart, labs and discussed the procedure including the risks, benefits and alternatives for the proposed anesthesia with the patient or authorized representative who has indicated his/her understanding and acceptance.     Dental advisory given  Plan Discussed with: CRNA, Anesthesiologist and Surgeon  Anesthesia Plan Comments:         Anesthesia Quick Evaluation

## 2020-10-07 NOTE — Anesthesia Preprocedure Evaluation (Deleted)
Anesthesia Evaluation  Patient identified by MRN, date of birth, ID band Patient awake    Reviewed: Allergy & Precautions, NPO status , Patient's Chart, lab work & pertinent test results  Airway Mallampati: I  TM Distance: >3 FB Neck ROM: full    Dental no notable dental hx.    Pulmonary neg pulmonary ROS, asthma ,    Pulmonary exam normal breath sounds clear to auscultation       Cardiovascular hypertension, Pt. on medications negative cardio ROS Normal cardiovascular exam Rhythm:regular Rate:Normal     Neuro/Psych negative neurological ROS  negative psych ROS   GI/Hepatic negative GI ROS, Neg liver ROS,   Endo/Other  negative endocrine ROS  Renal/GU negative Renal ROS  negative genitourinary   Musculoskeletal negative musculoskeletal ROS (+)   Abdominal   Peds negative pediatric ROS (+)  Hematology negative hematology ROS (+)   Anesthesia Other Findings   Reproductive/Obstetrics negative OB ROS                             Anesthesia Physical  Anesthesia Plan  ASA: II  Anesthesia Plan: General   Post-op Pain Management:    Induction: Intravenous  PONV Risk Score and Plan: 3 and Ondansetron, Dexamethasone, Midazolam and Treatment may vary due to age or medical condition  Airway Management Planned: LMA  Additional Equipment:   Intra-op Plan:   Post-operative Plan: Extubation in OR  Informed Consent: I have reviewed the patients History and Physical, chart, labs and discussed the procedure including the risks, benefits and alternatives for the proposed anesthesia with the patient or authorized representative who has indicated his/her understanding and acceptance.       Plan Discussed with: CRNA, Anesthesiologist and Surgeon  Anesthesia Plan Comments:         Anesthesia Quick Evaluation                                  Anesthesia Evaluation  Patient identified  by MRN, date of birth, ID band Patient awake    Airway Mallampati: I TM Distance: >3 FB Neck ROM: full    Dental   Pulmonary asthma ,          Cardiovascular Rhythm:regular Rate:Normal     Neuro/Psych    GI/Hepatic   Endo/Other    Renal/GU      Musculoskeletal   Abdominal   Peds  Hematology   Anesthesia Other Findings   Reproductive/Obstetrics                           Anesthesia Physical Anesthesia Plan  ASA: II  Anesthesia Plan: General   Post-op Pain Management:    Induction: Intravenous  Airway Management Planned: LMA and Oral ETT  Additional Equipment:   Intra-op Plan:   Post-operative Plan: Extubation in OR  Informed Consent: I have reviewed the patients History and Physical, chart, labs and discussed the procedure including the risks, benefits and alternatives for the proposed anesthesia with the patient or authorized representative who has indicated his/her understanding and acceptance.     Plan Discussed with: CRNA, Anesthesiologist and Surgeon  Anesthesia Plan Comments:         Anesthesia Quick Evaluation

## 2020-10-08 ENCOUNTER — Encounter (HOSPITAL_BASED_OUTPATIENT_CLINIC_OR_DEPARTMENT_OTHER): Payer: Self-pay | Admitting: Surgery

## 2020-10-11 LAB — SURGICAL PATHOLOGY

## 2020-10-22 ENCOUNTER — Ambulatory Visit: Payer: BC Managed Care – PPO | Attending: Internal Medicine

## 2020-10-22 DIAGNOSIS — Z23 Encounter for immunization: Secondary | ICD-10-CM

## 2020-10-22 NOTE — Progress Notes (Signed)
   Covid-19 Vaccination Clinic  Name:  LUCETTE KRATZ    MRN: [id number] DOB: November 01, 1966  10/22/2020  Ms. Pipkins was observed post Covid-19 immunization for 15 minutes without incident. She was provided with Vaccine Information Sheet and instruction to access the V-Safe system.   Ms. Havener was instructed to call 911 with any severe reactions post vaccine: Marland Kitchen Difficulty breathing  . Swelling of face and throat  . A fast heartbeat  . A bad rash all over body  . Dizziness and weakness   Immunizations Administered    Name Date Dose VIS Date Route   Pfizer COVID-19 Vaccine 10/22/2020  3:23 PM 0.3 mL 09/08/2020 Intramuscular   Manufacturer: [place]   Lot: [id number]   NDC: [address]-8

## 2021-07-11 DIAGNOSIS — E785 Hyperlipidemia, unspecified: Secondary | ICD-10-CM | POA: Diagnosis not present

## 2021-07-13 DIAGNOSIS — Z1231 Encounter for screening mammogram for malignant neoplasm of breast: Secondary | ICD-10-CM | POA: Diagnosis not present

## 2021-07-13 DIAGNOSIS — Z01419 Encounter for gynecological examination (general) (routine) without abnormal findings: Secondary | ICD-10-CM | POA: Diagnosis not present

## 2021-07-13 DIAGNOSIS — Z6825 Body mass index (BMI) 25.0-25.9, adult: Secondary | ICD-10-CM | POA: Diagnosis not present

## 2021-07-15 ENCOUNTER — Other Ambulatory Visit: Payer: Self-pay | Admitting: Radiology

## 2021-07-15 DIAGNOSIS — Z9189 Other specified personal risk factors, not elsewhere classified: Secondary | ICD-10-CM

## 2021-07-18 ENCOUNTER — Other Ambulatory Visit: Payer: BC Managed Care – PPO

## 2021-07-18 ENCOUNTER — Other Ambulatory Visit: Payer: Self-pay | Admitting: Internal Medicine

## 2021-07-18 DIAGNOSIS — Z0000 Encounter for general adult medical examination without abnormal findings: Secondary | ICD-10-CM | POA: Diagnosis not present

## 2021-07-18 DIAGNOSIS — Z1331 Encounter for screening for depression: Secondary | ICD-10-CM | POA: Diagnosis not present

## 2021-07-18 DIAGNOSIS — I10 Essential (primary) hypertension: Secondary | ICD-10-CM | POA: Diagnosis not present

## 2021-07-18 DIAGNOSIS — R82998 Other abnormal findings in urine: Secondary | ICD-10-CM | POA: Diagnosis not present

## 2021-07-18 DIAGNOSIS — E785 Hyperlipidemia, unspecified: Secondary | ICD-10-CM

## 2021-07-18 DIAGNOSIS — Z23 Encounter for immunization: Secondary | ICD-10-CM | POA: Diagnosis not present

## 2021-07-18 DIAGNOSIS — Z1339 Encounter for screening examination for other mental health and behavioral disorders: Secondary | ICD-10-CM | POA: Diagnosis not present

## 2021-08-01 ENCOUNTER — Ambulatory Visit
Admission: RE | Admit: 2021-08-01 | Discharge: 2021-08-01 | Disposition: A | Discharge status: Home / Self Care | Payer: BC Managed Care – PPO | Source: Ambulatory Visit | Attending: Radiology | Admitting: Radiology

## 2021-08-01 DIAGNOSIS — R928 Other abnormal and inconclusive findings on diagnostic imaging of breast: Secondary | ICD-10-CM | POA: Diagnosis not present

## 2021-08-01 DIAGNOSIS — Z9189 Other specified personal risk factors, not elsewhere classified: Secondary | ICD-10-CM

## 2021-08-01 MED ORDER — GADOBUTROL 1 MMOL/ML IV SOLN
6.0000 mL | Freq: Once | INTRAVENOUS | Status: AC | PRN
  Administered 2021-08-01: 6 mL via INTRAVENOUS

## 2021-08-12 ENCOUNTER — Ambulatory Visit
Admission: RE | Admit: 2021-08-12 | Discharge: 2021-08-12 | Disposition: A | Discharge status: Home / Self Care | Payer: No Typology Code available for payment source | Source: Ambulatory Visit | Attending: Internal Medicine | Admitting: Internal Medicine

## 2021-08-12 DIAGNOSIS — E785 Hyperlipidemia, unspecified: Secondary | ICD-10-CM

## 2021-09-24 IMAGING — [facility] BREAST LOC DEV 1ST LESION INC MAMMO GUIDE*L*
6 series · 6 of 6 positions shown · non-contrast
Comparison: Previous exam(s).

CLINICAL DATA: Pre surgical excision localization of a recently
biopsied complex sclerosing lesion in the lateral left breast.

EXAM:
MAMMOGRAPHIC GUIDED RADIOACTIVE SEED LOCALIZATION OF THE LEFT BREAST

[L CC (1 of 3)]
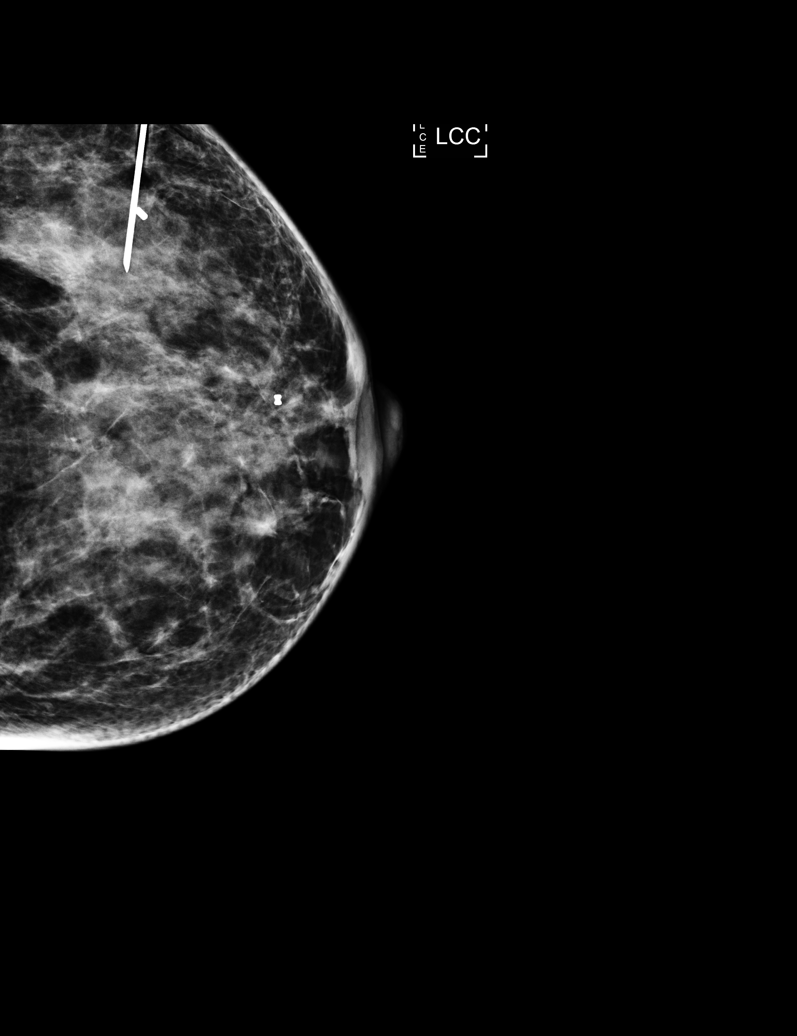

[L LM (1 of 3)]
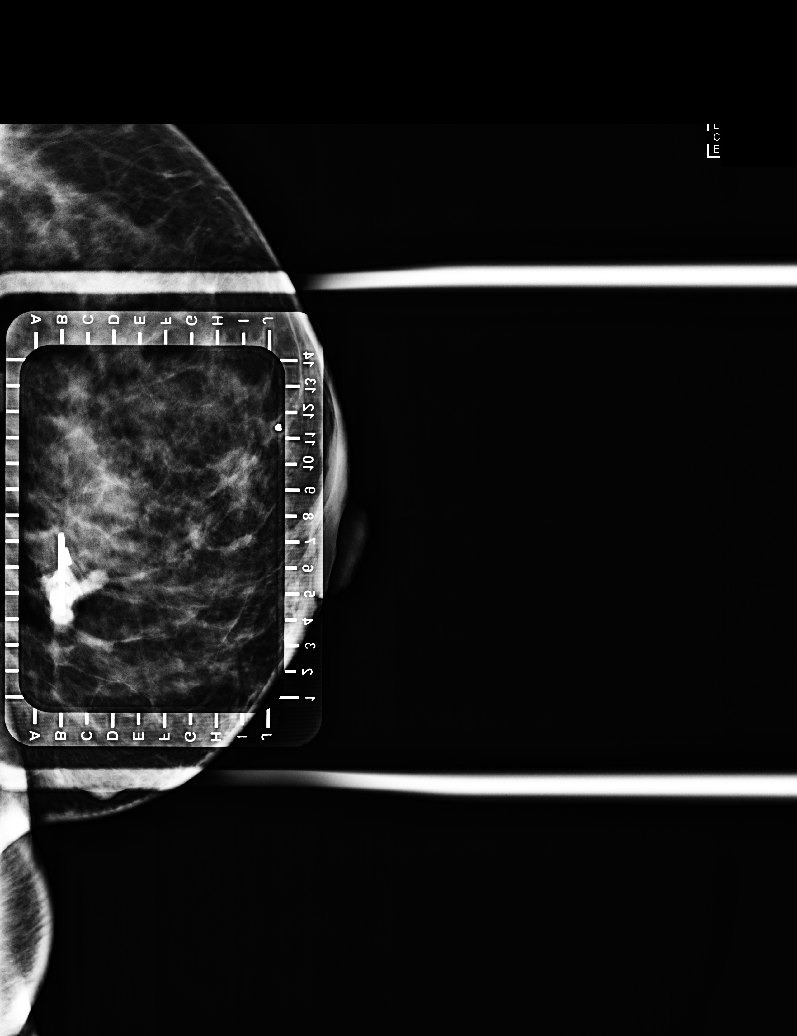

[L CC (2 of 3)]
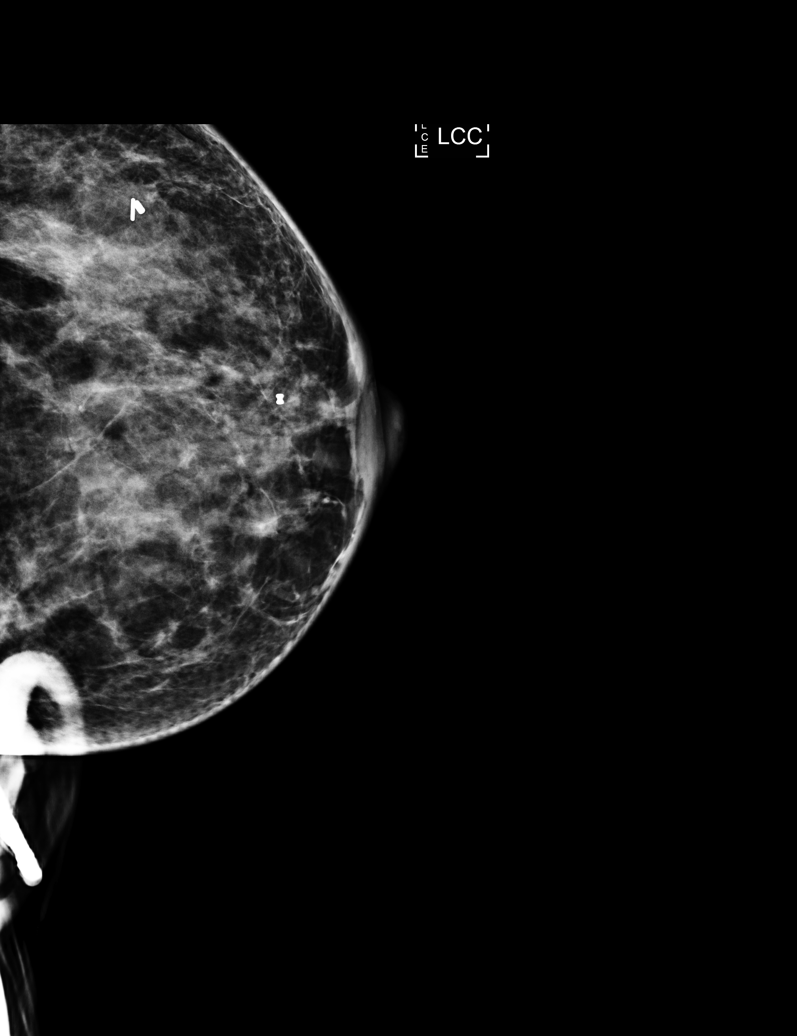

[L LM (2 of 3)]
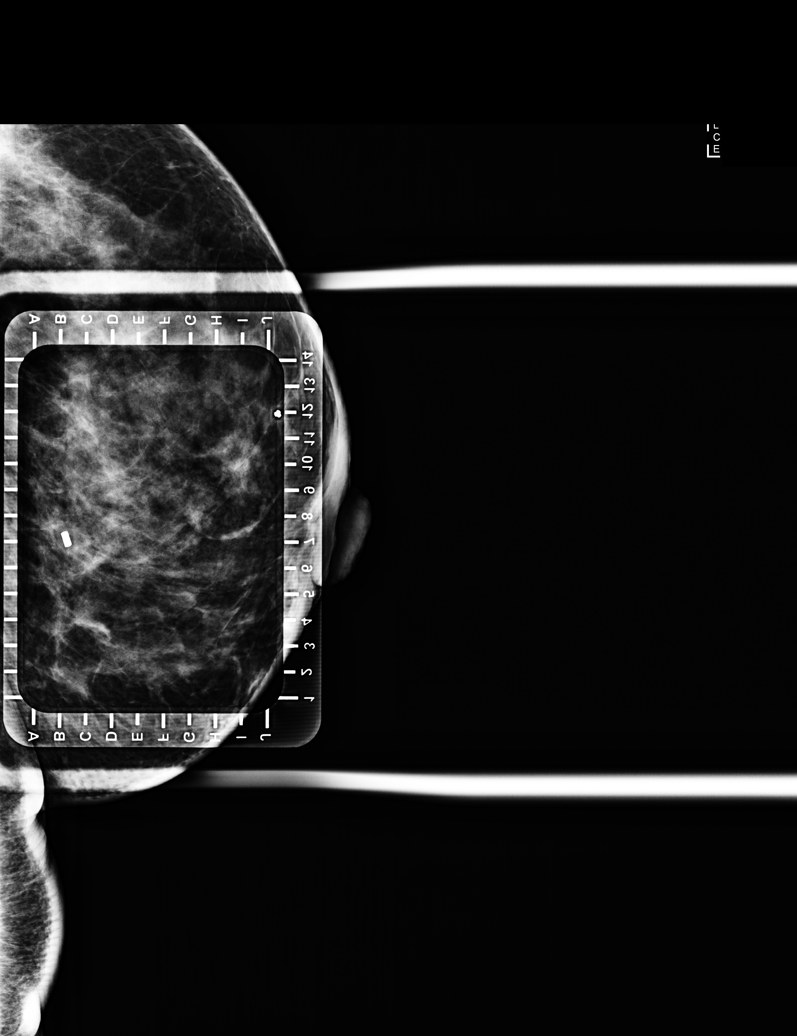

[L LM (3 of 3)]
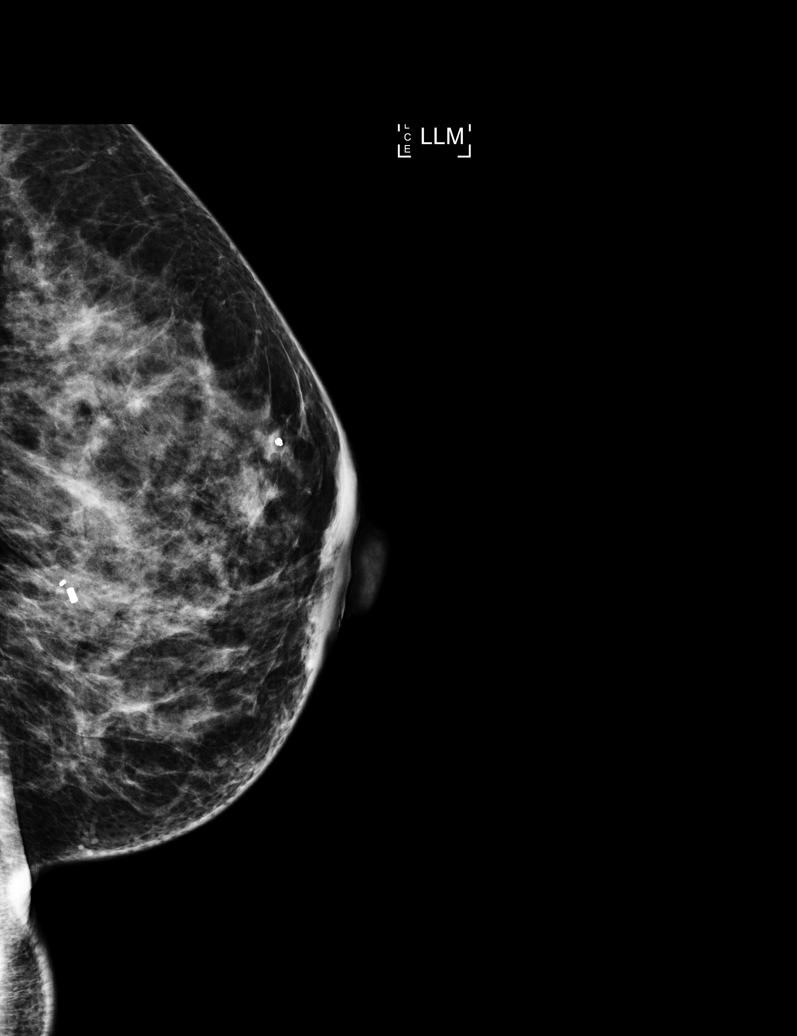

[L CC (3 of 3)]
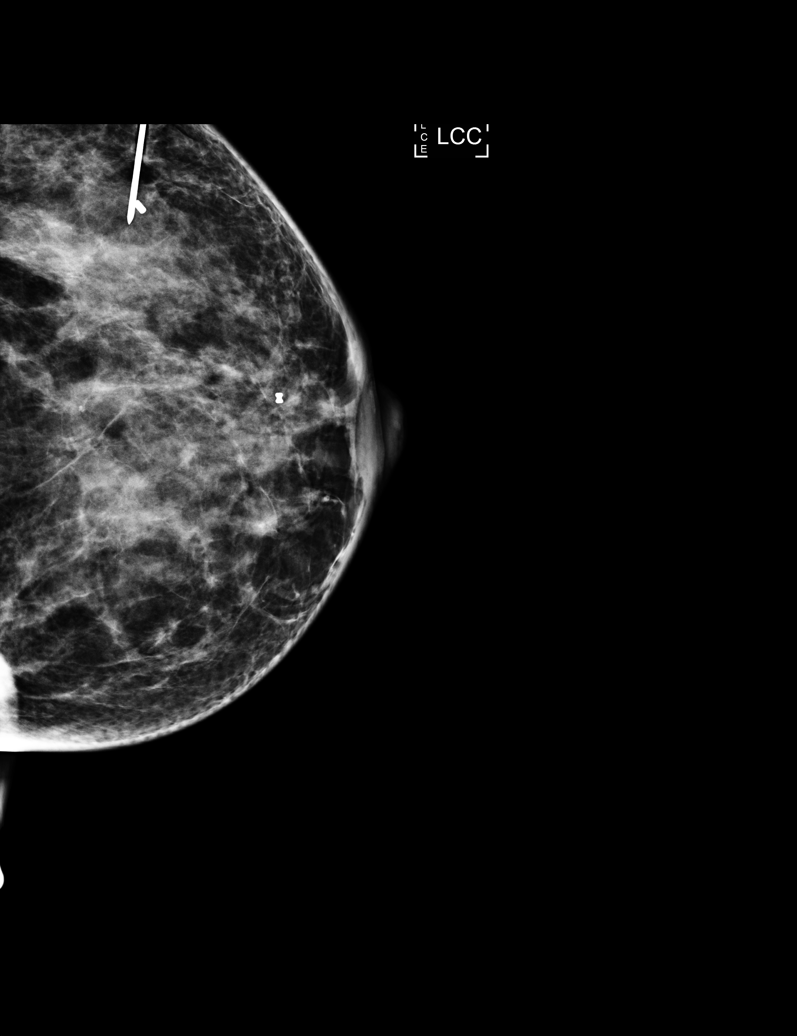

[6 of 6 positions shown; findings below may reference images not displayed]

FINDINGS: Patient presents for radioactive seed localization prior to left
breast excision. I met with the patient and we discussed the
procedure of seed localization including benefits and alternatives.
We discussed the high likelihood of a successful procedure. We
discussed the risks of the procedure including infection, bleeding,
tissue injury and further surgery. We discussed the low dose of
radioactivity involved in the procedure. Informed, written consent
was given.

The usual time-out protocol was performed immediately prior to the
procedure.

Using mammographic guidance, sterile technique, 1% lidocaine and an
[id number] radioactive seed, the recently placed cylinder-shaped biopsy
marker clip in the lateral left breast was localized using a lateral
approach. The follow-up mammogram images confirm the seed in the
expected location and were marked for Dr. Yomaira.

Follow-up survey of the patient confirms presence of the radioactive
seed.

Order number of [id number] seed:  [id number].

Total activity:  0.241 mCi reference Date: 09/15/2020

The patient tolerated the procedure well and was released from the
[REDACTED]. She was given instructions regarding seed removal.
IMPRESSION: Radioactive seed localization left breast. No apparent
complications.

## 2021-11-10 DIAGNOSIS — L820 Inflamed seborrheic keratosis: Secondary | ICD-10-CM | POA: Diagnosis not present

## 2021-11-10 DIAGNOSIS — L298 Other pruritus: Secondary | ICD-10-CM | POA: Diagnosis not present

## 2022-01-03 ENCOUNTER — Encounter (HOSPITAL_COMMUNITY): Payer: Self-pay

## 2022-02-02 DIAGNOSIS — E785 Hyperlipidemia, unspecified: Secondary | ICD-10-CM | POA: Diagnosis not present

## 2022-04-13 ENCOUNTER — Ambulatory Visit: Payer: BC Managed Care – PPO | Admitting: Podiatry

## 2022-04-13 ENCOUNTER — Ambulatory Visit (INDEPENDENT_AMBULATORY_CARE_PROVIDER_SITE_OTHER): Payer: BC Managed Care – PPO

## 2022-04-13 DIAGNOSIS — D3610 Benign neoplasm of peripheral nerves and autonomic nervous system, unspecified: Secondary | ICD-10-CM

## 2022-04-13 DIAGNOSIS — M778 Other enthesopathies, not elsewhere classified: Secondary | ICD-10-CM

## 2022-04-13 DIAGNOSIS — L600 Ingrowing nail: Secondary | ICD-10-CM | POA: Diagnosis not present

## 2022-04-13 DIAGNOSIS — G5781 Other specified mononeuropathies of right lower limb: Secondary | ICD-10-CM | POA: Diagnosis not present

## 2022-04-13 DIAGNOSIS — G5782 Other specified mononeuropathies of left lower limb: Secondary | ICD-10-CM

## 2022-04-13 NOTE — Progress Notes (Signed)
SITUATION Reason for Consult: Evaluation for Bilateral Custom Foot Orthoses Patient / Caregiver Report: Patient is ready for foot orthotics  OBJECTIVE DATA: Patient History / Diagnosis:    ICD-10-CM   1. 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  ACTIONS PERFORMED Potential out of pocket cost was communicated to patient. Patient understood and consent to casting. Patient was casted for Foot Orthoses via crush box. Procedure was explained and patient tolerated procedure well. Casts were shipped to central fabrication. All questions were answered and concerns addressed.  PLAN Patient is to be called for fitting when devices are ready.

## 2022-04-16 NOTE — Progress Notes (Signed)
Subjective:  Patient ID: Crystal Maynard, female    DOB: 06/06/1966,  MRN: [id number] HPI Chief Complaint  Patient presents with   Foot Orthotics    needs new orthotics**seeing Aaron Edelman @ 1000    56 y.o. female presents with the above complaint.   ROS: Denies fever chills nausea vomiting muscle aches pains calf pain back pain chest pain shortness of breath.  He is complaining about hangnail to the fifth toe bilaterally and states that her neuroma has started to act up and feels that she needs new orthotics.  Past Medical History:  Diagnosis Date   Allergy    Asthma    Breast mass    left breast   Chronic kidney disease    kidney stones   Family history of malignant neoplasm of breast    Hypertension    Past Surgical History:  Procedure Laterality Date   BREAST BIOPSY Right 02/18/2015   BREAST BIOPSY Right 07/07/2013   BREAST BIOPSY  04/01/2012   BREAST LUMPECTOMY  08/09/2012   Procedure: LUMPECTOMY;  Surgeon: Ralene Ok, MD;  Location: [facility];  Service: General;  Laterality: Left;   BREAST LUMPECTOMY WITH RADIOACTIVE SEED LOCALIZATION Left 10/07/2020   Procedure: LEFT BREAST LUMPECTOMY WITH RADIOACTIVE SEED LOCALIZATION;  Surgeon: Coralie Keens, MD;  Location: [facility];  Service: General;  Laterality: Left;  LMA   DILATION AND CURETTAGE OF UTERUS     HAND SURGERY     3 surgeries on left hand    Current Outpatient Medications:    albuterol (PROVENTIL HFA;VENTOLIN HFA) 108 (90 BASE) MCG/ACT inhaler, Inhale 2 puffs into the lungs every 6 (six) hours as needed. For shortness of breath, Disp: , Rfl:    cetirizine (ZYRTEC) 10 MG tablet, Take 10 mg by mouth daily., Disp: , Rfl:    levonorgestrel (MIRENA) 20 MCG/24HR IUD, 1 each by Intrauterine route once., Disp: , Rfl:    mometasone (ASMANEX) 220 MCG/INH inhaler, Inhale 2 puffs into the lungs daily as needed. , Disp: , Rfl:    olmesartan (BENICAR) 20 MG tablet, Take 20 mg by mouth daily., Disp: , Rfl:     oxyCODONE (OXY IR/ROXICODONE) 5 MG immediate release tablet, Take 1 tablet (5 mg total) by mouth every 6 (six) hours as needed for moderate pain or severe pain., Disp: 20 tablet, Rfl: 0  Allergies  Allergen Reactions   Amoxicillin-Pot Clavulanate Rash    Itchy rash    Review of Systems Objective:  There were no vitals filed for this visit.  General: Well developed, nourished, in no acute distress, alert and oriented x3   Dermatological: Skin is warm, dry and supple bilateral. Nails x 10 are well maintained; remaining integument appears unremarkable at this time. There are no open sores, no preulcerative lesions, no rash or signs of infection present.  Small spicule of nail is noted to the lateral border of the fifth digits bilateral.  Mild erythema no cellulitis drainage or odor no tenderness.  Vascular: Dorsalis Pedis artery and Posterior Tibial artery pedal pulses are 2/4 bilateral with immedate capillary fill time. Pedal hair growth present. No varicosities and no lower extremity edema present bilateral.   Neruologic: Grossly intact via light touch bilateral. Vibratory intact via tuning fork bilateral. Protective threshold with Semmes Wienstein monofilament intact to all pedal sites bilateral. Patellar and Achilles deep tendon reflexes 2+ bilateral. No Babinski or clonus noted bilateral.  Mild tenderness on palpation to the third interdigital space bilateral.  Musculoskeletal: No gross boney pedal  deformities bilateral. No pain, crepitus, or limitation noted with foot and ankle range of motion bilateral. Muscular strength 5/5 in all groups tested bilateral.  Gait: Unassisted, Nonantalgic.    Radiographs:  None taken  Assessment & Plan:   Assessment: History of neuroma.  Painful lateral margin fifth digits bilateral.  Plan: She will follow-up with Aaron Edelman today for a new set of orthotics.  Hopefully she will be able to have her old orthotics recovered.  We will have her back in June  to perform bilateral matricectomy to the fifth toe.  This will be performed bilaterally.  She was taken to [facility] today and placed inside waiting area.     Evonte Prestage T. [place], [place]

## 2022-04-28 DIAGNOSIS — H16223 Keratoconjunctivitis sicca, not specified as Sjogren's, bilateral: Secondary | ICD-10-CM | POA: Diagnosis not present

## 2022-04-28 DIAGNOSIS — H0288A Meibomian gland dysfunction right eye, upper and lower eyelids: Secondary | ICD-10-CM | POA: Diagnosis not present

## 2022-04-28 DIAGNOSIS — H0288B Meibomian gland dysfunction left eye, upper and lower eyelids: Secondary | ICD-10-CM | POA: Diagnosis not present

## 2022-05-16 ENCOUNTER — Ambulatory Visit: Payer: BC Managed Care – PPO | Admitting: Podiatry

## 2022-05-16 ENCOUNTER — Encounter: Payer: Self-pay | Admitting: Podiatry

## 2022-05-16 DIAGNOSIS — L600 Ingrowing nail: Secondary | ICD-10-CM

## 2022-05-16 MED ORDER — NEOMYCIN-POLYMYXIN-HC 1 % OT SOLN: OTIC | 1 refills | Status: AC

## 2022-06-12 ENCOUNTER — Other Ambulatory Visit: Payer: BC Managed Care – PPO

## 2022-06-13 ENCOUNTER — Ambulatory Visit (INDEPENDENT_AMBULATORY_CARE_PROVIDER_SITE_OTHER): Payer: BC Managed Care – PPO

## 2022-06-13 ENCOUNTER — Telehealth: Payer: Self-pay

## 2022-06-13 ENCOUNTER — Encounter: Payer: Self-pay | Admitting: Podiatry

## 2022-06-13 ENCOUNTER — Ambulatory Visit: Payer: BC Managed Care – PPO | Admitting: Podiatry

## 2022-06-13 DIAGNOSIS — G5782 Other specified mononeuropathies of left lower limb: Secondary | ICD-10-CM

## 2022-06-13 DIAGNOSIS — Z9889 Other specified postprocedural states: Secondary | ICD-10-CM

## 2022-06-13 DIAGNOSIS — L600 Ingrowing nail: Secondary | ICD-10-CM

## 2022-06-13 NOTE — Progress Notes (Signed)
She presents today for follow-up of her matrixectomy's fifth digits bilaterally.  She denies fever chills nausea vomit muscle aches and pains states that she really has not soaked in about 2 weeks she is also here today to pick up her orthotics.  Objective: Vital signs are stable she is alert and oriented x3/5 toes lateral borders demonstrate scab present there is no erythema no edema salines drainage odor nontender on palpation.  Assessment: Well-healing surgical toes.  Plan: Redressed today with Band-Aids she will follow-up with [facility] to pick up her orthotics in just a few minutes.

## 2022-06-13 NOTE — Telephone Encounter (Signed)
Called patient to notify her that the fee for refurbishing old orthotics will be $90. Patient confirmed that she is okay with price and would like them sent out for refurbishing.

## 2022-06-13 NOTE — Progress Notes (Signed)
Patient presents today to pick up custom molded foot orthotics, diagnosed with neuroma of third interspace  by Dr. Milinda Pointer.   Orthotics were dispensed and fit was satisfactory. Reviewed instructions for break-in and wear. Written instructions given to patient.  Patient will follow up as needed.   [name] - order # [id number]  Patient also requested refurbish of the old orthotics. Patient left old othotics here tin the office. Advised I would find out the cost to have them refurbished before sending them out to [employer]. Patient verbalized understanding.

## 2022-07-17 DIAGNOSIS — Z6824 Body mass index (BMI) 24.0-24.9, adult: Secondary | ICD-10-CM | POA: Diagnosis not present

## 2022-07-17 DIAGNOSIS — Z01419 Encounter for gynecological examination (general) (routine) without abnormal findings: Secondary | ICD-10-CM | POA: Diagnosis not present

## 2022-07-19 ENCOUNTER — Other Ambulatory Visit: Payer: Self-pay | Admitting: Obstetrics and Gynecology

## 2022-07-19 DIAGNOSIS — N644 Mastodynia: Secondary | ICD-10-CM

## 2022-07-20 ENCOUNTER — Other Ambulatory Visit: Payer: Self-pay | Admitting: Obstetrics and Gynecology

## 2022-07-20 DIAGNOSIS — N644 Mastodynia: Secondary | ICD-10-CM

## 2022-07-26 ENCOUNTER — Ambulatory Visit (INDEPENDENT_AMBULATORY_CARE_PROVIDER_SITE_OTHER): Payer: BC Managed Care – PPO | Admitting: *Deleted

## 2022-07-26 DIAGNOSIS — G5782 Other specified mononeuropathies of left lower limb: Secondary | ICD-10-CM

## 2022-07-26 NOTE — Progress Notes (Signed)
Patient presents today to pick up custom molded foot orthotics, diagnosed with neuroma by Dr. Milinda Pointer.   Orthotics were dispensed and fit was satisfactory. Reviewed instructions for break-in and wear. Written instructions given to patient.  Patient will follow up as needed.

## 2022-08-01 ENCOUNTER — Other Ambulatory Visit: Payer: BC Managed Care – PPO

## 2022-08-03 ENCOUNTER — Ambulatory Visit
Admission: RE | Admit: 2022-08-03 | Discharge: 2022-08-03 | Disposition: A | Discharge status: Home / Self Care | Payer: BC Managed Care – PPO | Source: Ambulatory Visit | Attending: Obstetrics and Gynecology | Admitting: Obstetrics and Gynecology

## 2022-08-03 ENCOUNTER — Other Ambulatory Visit: Payer: Self-pay | Admitting: Obstetrics and Gynecology

## 2022-08-03 DIAGNOSIS — N644 Mastodynia: Secondary | ICD-10-CM

## 2022-08-09 ENCOUNTER — Other Ambulatory Visit: Payer: Self-pay | Admitting: Obstetrics and Gynecology

## 2022-08-09 DIAGNOSIS — Z803 Family history of malignant neoplasm of breast: Secondary | ICD-10-CM

## 2022-08-10 DIAGNOSIS — I10 Essential (primary) hypertension: Secondary | ICD-10-CM | POA: Diagnosis not present

## 2022-08-16 DIAGNOSIS — Z1331 Encounter for screening for depression: Secondary | ICD-10-CM | POA: Diagnosis not present

## 2022-08-16 DIAGNOSIS — Z0000 Encounter for general adult medical examination without abnormal findings: Secondary | ICD-10-CM | POA: Diagnosis not present

## 2022-08-16 DIAGNOSIS — I10 Essential (primary) hypertension: Secondary | ICD-10-CM | POA: Diagnosis not present

## 2022-08-16 DIAGNOSIS — Z23 Encounter for immunization: Secondary | ICD-10-CM | POA: Diagnosis not present

## 2022-08-16 DIAGNOSIS — R82998 Other abnormal findings in urine: Secondary | ICD-10-CM | POA: Diagnosis not present

## 2022-08-16 DIAGNOSIS — Z1339 Encounter for screening examination for other mental health and behavioral disorders: Secondary | ICD-10-CM | POA: Diagnosis not present

## 2022-08-19 ENCOUNTER — Other Ambulatory Visit: Payer: BC Managed Care – PPO

## 2022-08-21 DIAGNOSIS — H16223 Keratoconjunctivitis sicca, not specified as Sjogren's, bilateral: Secondary | ICD-10-CM | POA: Diagnosis not present

## 2022-08-21 DIAGNOSIS — H0288B Meibomian gland dysfunction left eye, upper and lower eyelids: Secondary | ICD-10-CM | POA: Diagnosis not present

## 2022-08-21 DIAGNOSIS — H0288A Meibomian gland dysfunction right eye, upper and lower eyelids: Secondary | ICD-10-CM | POA: Diagnosis not present

## 2022-09-04 ENCOUNTER — Ambulatory Visit (INDEPENDENT_AMBULATORY_CARE_PROVIDER_SITE_OTHER): Payer: BC Managed Care – PPO

## 2022-09-04 DIAGNOSIS — G5782 Other specified mononeuropathies of left lower limb: Secondary | ICD-10-CM

## 2022-09-04 NOTE — Progress Notes (Signed)
Patient presents today to pick up custom molded foot orthotics that had been refurbished.   Orthotics were dispensed and fit was satisfactory. Reviewed instructions for break-in and wear. Written instructions given to patient.  Patient will follow up as needed.   [name] - order # [id number]

## 2022-09-06 ENCOUNTER — Ambulatory Visit
Admission: RE | Admit: 2022-09-06 | Discharge: 2022-09-06 | Disposition: A | Discharge status: Home / Self Care | Payer: BC Managed Care – PPO | Source: Ambulatory Visit | Attending: Obstetrics and Gynecology | Admitting: Obstetrics and Gynecology

## 2022-09-06 DIAGNOSIS — Z803 Family history of malignant neoplasm of breast: Secondary | ICD-10-CM | POA: Diagnosis not present

## 2022-09-06 DIAGNOSIS — Z1239 Encounter for other screening for malignant neoplasm of breast: Secondary | ICD-10-CM | POA: Diagnosis not present

## 2022-09-06 MED ORDER — GADOPICLENOL 0.5 MMOL/ML IV SOLN
8.0000 mL | Freq: Once | INTRAVENOUS | Status: AC | PRN
  Administered 2022-09-06: 8 mL via INTRAVENOUS

## 2022-12-07 DIAGNOSIS — H16223 Keratoconjunctivitis sicca, not specified as Sjogren's, bilateral: Secondary | ICD-10-CM | POA: Diagnosis not present

## 2023-01-08 DIAGNOSIS — L570 Actinic keratosis: Secondary | ICD-10-CM | POA: Diagnosis not present

## 2023-01-08 DIAGNOSIS — L821 Other seborrheic keratosis: Secondary | ICD-10-CM | POA: Diagnosis not present

## 2023-01-08 DIAGNOSIS — L814 Other melanin hyperpigmentation: Secondary | ICD-10-CM | POA: Diagnosis not present

## 2023-01-08 DIAGNOSIS — D2362 Other benign neoplasm of skin of left upper limb, including shoulder: Secondary | ICD-10-CM | POA: Diagnosis not present

## 2023-01-08 DIAGNOSIS — D2371 Other benign neoplasm of skin of right lower limb, including hip: Secondary | ICD-10-CM | POA: Diagnosis not present

## 2023-07-19 DIAGNOSIS — Z6825 Body mass index (BMI) 25.0-25.9, adult: Secondary | ICD-10-CM | POA: Diagnosis not present

## 2023-07-19 DIAGNOSIS — Z1231 Encounter for screening mammogram for malignant neoplasm of breast: Secondary | ICD-10-CM | POA: Diagnosis not present

## 2023-07-19 DIAGNOSIS — Z124 Encounter for screening for malignant neoplasm of cervix: Secondary | ICD-10-CM | POA: Diagnosis not present

## 2023-07-19 DIAGNOSIS — Z01419 Encounter for gynecological examination (general) (routine) without abnormal findings: Secondary | ICD-10-CM | POA: Diagnosis not present

## 2023-07-20 ENCOUNTER — Other Ambulatory Visit: Payer: Self-pay | Admitting: Family

## 2023-07-20 DIAGNOSIS — Z9189 Other specified personal risk factors, not elsewhere classified: Secondary | ICD-10-CM

## 2023-08-31 DIAGNOSIS — I10 Essential (primary) hypertension: Secondary | ICD-10-CM | POA: Diagnosis not present

## 2023-08-31 DIAGNOSIS — E785 Hyperlipidemia, unspecified: Secondary | ICD-10-CM | POA: Diagnosis not present

## 2023-08-31 DIAGNOSIS — Z1389 Encounter for screening for other disorder: Secondary | ICD-10-CM | POA: Diagnosis not present

## 2023-09-07 DIAGNOSIS — Z23 Encounter for immunization: Secondary | ICD-10-CM | POA: Diagnosis not present

## 2023-09-07 DIAGNOSIS — R82998 Other abnormal findings in urine: Secondary | ICD-10-CM | POA: Diagnosis not present

## 2023-09-07 DIAGNOSIS — Z1331 Encounter for screening for depression: Secondary | ICD-10-CM | POA: Diagnosis not present

## 2023-09-07 DIAGNOSIS — Z0000 Encounter for general adult medical examination without abnormal findings: Secondary | ICD-10-CM | POA: Diagnosis not present

## 2023-09-07 DIAGNOSIS — Z1339 Encounter for screening examination for other mental health and behavioral disorders: Secondary | ICD-10-CM | POA: Diagnosis not present

## 2023-09-07 DIAGNOSIS — I10 Essential (primary) hypertension: Secondary | ICD-10-CM | POA: Diagnosis not present

## 2023-10-08 ENCOUNTER — Ambulatory Visit
Admission: RE | Admit: 2023-10-08 | Discharge: 2023-10-08 | Disposition: A | Discharge status: Home / Self Care | Payer: BC Managed Care – PPO | Source: Ambulatory Visit | Attending: Family | Admitting: Family

## 2023-10-08 DIAGNOSIS — Z9189 Other specified personal risk factors, not elsewhere classified: Secondary | ICD-10-CM

## 2023-10-08 DIAGNOSIS — Z1239 Encounter for other screening for malignant neoplasm of breast: Secondary | ICD-10-CM | POA: Diagnosis not present

## 2023-10-08 DIAGNOSIS — Z803 Family history of malignant neoplasm of breast: Secondary | ICD-10-CM | POA: Diagnosis not present

## 2023-10-08 MED ORDER — GADOPICLENOL 0.5 MMOL/ML IV SOLN
7.0000 mL | Freq: Once | INTRAVENOUS | Status: AC | PRN
  Administered 2023-10-08: 7 mL via INTRAVENOUS

## 2024-01-16 DIAGNOSIS — L814 Other melanin hyperpigmentation: Secondary | ICD-10-CM | POA: Diagnosis not present

## 2024-01-16 DIAGNOSIS — D2362 Other benign neoplasm of skin of left upper limb, including shoulder: Secondary | ICD-10-CM | POA: Diagnosis not present

## 2024-01-16 DIAGNOSIS — D2371 Other benign neoplasm of skin of right lower limb, including hip: Secondary | ICD-10-CM | POA: Diagnosis not present

## 2024-01-16 DIAGNOSIS — R208 Other disturbances of skin sensation: Secondary | ICD-10-CM | POA: Diagnosis not present

## 2024-01-16 DIAGNOSIS — L820 Inflamed seborrheic keratosis: Secondary | ICD-10-CM | POA: Diagnosis not present

## 2024-01-16 DIAGNOSIS — L538 Other specified erythematous conditions: Secondary | ICD-10-CM | POA: Diagnosis not present

## 2024-01-16 DIAGNOSIS — D2372 Other benign neoplasm of skin of left lower limb, including hip: Secondary | ICD-10-CM | POA: Diagnosis not present

## 2024-07-23 DIAGNOSIS — Z1231 Encounter for screening mammogram for malignant neoplasm of breast: Secondary | ICD-10-CM | POA: Diagnosis not present

## 2024-07-23 DIAGNOSIS — Z01419 Encounter for gynecological examination (general) (routine) without abnormal findings: Secondary | ICD-10-CM | POA: Diagnosis not present

## 2024-07-23 DIAGNOSIS — R232 Flushing: Secondary | ICD-10-CM | POA: Diagnosis not present

## 2024-07-23 DIAGNOSIS — Z6825 Body mass index (BMI) 25.0-25.9, adult: Secondary | ICD-10-CM | POA: Diagnosis not present

## 2024-07-24 ENCOUNTER — Other Ambulatory Visit: Payer: Self-pay | Admitting: Family

## 2024-07-24 DIAGNOSIS — Z9189 Other specified personal risk factors, not elsewhere classified: Secondary | ICD-10-CM

## 2024-07-30 DIAGNOSIS — Z13820 Encounter for screening for osteoporosis: Secondary | ICD-10-CM | POA: Diagnosis not present

## 2024-09-29 DIAGNOSIS — I10 Essential (primary) hypertension: Secondary | ICD-10-CM | POA: Diagnosis not present

## 2024-10-06 DIAGNOSIS — Z1331 Encounter for screening for depression: Secondary | ICD-10-CM | POA: Diagnosis not present

## 2024-10-06 DIAGNOSIS — Z0000 Encounter for general adult medical examination without abnormal findings: Secondary | ICD-10-CM | POA: Diagnosis not present

## 2024-10-06 DIAGNOSIS — I10 Essential (primary) hypertension: Secondary | ICD-10-CM | POA: Diagnosis not present

## 2024-10-06 DIAGNOSIS — R82998 Other abnormal findings in urine: Secondary | ICD-10-CM | POA: Diagnosis not present

## 2024-10-06 DIAGNOSIS — Z1339 Encounter for screening examination for other mental health and behavioral disorders: Secondary | ICD-10-CM | POA: Diagnosis not present

## 2024-10-06 DIAGNOSIS — M8580 Other specified disorders of bone density and structure, unspecified site: Secondary | ICD-10-CM | POA: Diagnosis not present

## 2024-10-06 DIAGNOSIS — Z23 Encounter for immunization: Secondary | ICD-10-CM | POA: Diagnosis not present

## 2024-10-14 ENCOUNTER — Encounter: Payer: Self-pay | Admitting: Family

## 2024-10-25 ENCOUNTER — Ambulatory Visit
Admission: RE | Admit: 2024-10-25 | Discharge: 2024-10-25 | Disposition: A | Source: Ambulatory Visit | Attending: Family | Admitting: Family

## 2024-10-25 DIAGNOSIS — Z9189 Other specified personal risk factors, not elsewhere classified: Secondary | ICD-10-CM

## 2024-10-25 DIAGNOSIS — Z1239 Encounter for other screening for malignant neoplasm of breast: Secondary | ICD-10-CM | POA: Diagnosis not present

## 2024-10-25 MED ORDER — GADOPICLENOL 0.5 MMOL/ML IV SOLN
6.0000 mL | Freq: Once | INTRAVENOUS | Status: AC | PRN
  Administered 2024-10-25: 6 mL via INTRAVENOUS
